# Patient Record
Sex: Female | Born: 1985 | Hispanic: No | Marital: Married | Smoking: Never smoker
Health system: Southern US, Community
[De-identification: ages and names within clinical notes are randomized; demographics above are authoritative.]

## PROBLEM LIST (undated history)

## (undated) ENCOUNTER — Inpatient Hospital Stay (HOSPITAL_COMMUNITY): Payer: Self-pay

## (undated) DIAGNOSIS — G43909 Migraine, unspecified, not intractable, without status migrainosus: Secondary | ICD-10-CM

## (undated) HISTORY — DX: Migraine, unspecified, not intractable, without status migrainosus: G43.909

---

## 2011-05-16 DIAGNOSIS — IMO0002 Reserved for concepts with insufficient information to code with codable children: Secondary | ICD-10-CM

## 2014-04-26 ENCOUNTER — Encounter (HOSPITAL_COMMUNITY): Payer: Self-pay | Admitting: *Deleted

## 2014-04-26 ENCOUNTER — Emergency Department (HOSPITAL_COMMUNITY)
Admission: EM | Admit: 2014-04-26 | Discharge: 2014-04-27 | Disposition: A | Discharge status: Home / Self Care | Payer: Self-pay | Attending: Emergency Medicine | Admitting: Emergency Medicine

## 2014-04-26 DIAGNOSIS — N390 Urinary tract infection, site not specified: Secondary | ICD-10-CM | POA: Insufficient documentation

## 2014-04-26 DIAGNOSIS — R000 Tachycardia, unspecified: Secondary | ICD-10-CM | POA: Insufficient documentation

## 2014-04-26 DIAGNOSIS — R109 Unspecified abdominal pain: Secondary | ICD-10-CM

## 2014-04-26 DIAGNOSIS — Z3201 Encounter for pregnancy test, result positive: Secondary | ICD-10-CM | POA: Insufficient documentation

## 2014-04-26 DIAGNOSIS — E876 Hypokalemia: Secondary | ICD-10-CM | POA: Insufficient documentation

## 2014-04-26 DIAGNOSIS — R112 Nausea with vomiting, unspecified: Secondary | ICD-10-CM | POA: Insufficient documentation

## 2014-04-26 DIAGNOSIS — O26899 Other specified pregnancy related conditions, unspecified trimester: Secondary | ICD-10-CM

## 2014-04-26 DIAGNOSIS — N760 Acute vaginitis: Secondary | ICD-10-CM | POA: Insufficient documentation

## 2014-04-26 DIAGNOSIS — B9689 Other specified bacterial agents as the cause of diseases classified elsewhere: Secondary | ICD-10-CM

## 2014-04-26 LAB — URINALYSIS, ROUTINE W REFLEX MICROSCOPIC
BILIRUBIN URINE: NEGATIVE
Glucose, UA: NEGATIVE mg/dL
Hgb urine dipstick: NEGATIVE
KETONES UR: NEGATIVE mg/dL
NITRITE: NEGATIVE
PROTEIN: NEGATIVE mg/dL
Specific Gravity, Urine: 1.017 (ref 1.005–1.030)
UROBILINOGEN UA: 0.2 mg/dL (ref 0.0–1.0)
pH: 5.5 (ref 5.0–8.0)

## 2014-04-26 LAB — URINE MICROSCOPIC-ADD ON

## 2014-04-26 LAB — PREGNANCY, URINE: Preg Test, Ur: POSITIVE — AB

## 2014-04-26 MED ORDER — ACETAMINOPHEN 325 MG PO TABS
650.0000 mg | ORAL_TABLET | Freq: Once | ORAL | Status: AC
  Administered 2014-04-27: 650 mg via ORAL
  Filled 2014-04-26: qty 2

## 2014-04-26 MED ORDER — SODIUM CHLORIDE 0.9 % IV BOLUS (SEPSIS)
1000.0000 mL | Freq: Once | INTRAVENOUS | Status: AC
  Administered 2014-04-27: 1000 mL via INTRAVENOUS

## 2014-04-26 NOTE — ED Provider Notes (Signed)
CSN: [id number]     Arrival date & time 04/26/14  2242 History   First MD Initiated Contact with Patient 04/26/14 2309     Chief Complaint  Patient presents with  . preg test request      (Consider location/radiation/quality/duration/timing/severity/associated sxs/prior Treatment) HPI Comments: Angela BurkeSehrish Molnar is a 28 y.o. G2P1 healthy female, who presents to the ED accompanied by her husband with complaints of 2 months of lower abdominal cramping and morning nausea/vomiting. History limited due to language barrier, interpreter used. Patient reports that she has had 9/10 crampy lower abdominal pain that radiates to her back is constant and worse in the morning, with no known aggravating or alleviating factors given that she has not tried anything. She reports that she has had intermittent nausea/vomiting especially in the mornings, but is able to tolerate fluid and foods. She states she is here for a pregnancy test. Her last menstrual period was 2 months ago. She additionally states that she has had some dysuria 2 weeks and malodorous urine. Denies fever, chills, chest pain, shortness breath, diarrhea, constipation, hematochezia, melena, hematuria, vaginal bleeding, vaginal discharge, lightheadedness, dizziness, or any other symptoms at this time. Patient reports that she has no medical history and takes no medications. She has not had any prenatal care.  Patient is a 28 y.o. female presenting with abdominal pain. The history is provided by the patient and the spouse. The history is limited by a language barrier. A language interpreter was used.  Abdominal Pain Pain location:  Suprapubic, LLQ and RLQ Pain quality: cramping   Pain radiates to:  Back Pain severity:  Moderate (9/10) Onset quality:  Gradual Duration:  8 weeks Timing:  Constant Progression:  Unchanged Chronicity:  New Context: not recent travel, not sick contacts and not suspicious food intake   Context comment:  Possible  pregnancy Relieved by:  None tried Worsened by:  Nothing tried Ineffective treatments:  None tried Associated symptoms: dysuria (x2 wks), nausea (morning sickness, intermittent) and vomiting   Associated symptoms: no anorexia, no chest pain, no chills, no constipation, no diarrhea, no fatigue, no fever, no flatus, no hematemesis, no hematochezia, no hematuria, no melena, no shortness of breath, no vaginal bleeding and no vaginal discharge   Risk factors: pregnancy     History reviewed. No pertinent past medical history. History reviewed. No pertinent past surgical history. No family history on file. History  Substance Use Topics  . Smoking status: Never Smoker   . Smokeless tobacco: Not on file  . Alcohol Use: No   OB History    No data available     Review of Systems  Constitutional: Negative for fever, chills and fatigue.  Respiratory: Negative for shortness of breath.   Cardiovascular: Negative for chest pain.  Gastrointestinal: Positive for nausea (morning sickness, intermittent), vomiting and abdominal pain. Negative for diarrhea, constipation, blood in stool, melena, hematochezia, anorexia, flatus and hematemesis.  Genitourinary: Positive for dysuria (x2 wks). Negative for urgency, frequency, hematuria, flank pain, decreased urine volume, vaginal bleeding and vaginal discharge.       +malodorous urine  Musculoskeletal: Positive for back pain (radiating from abd). Negative for myalgias and arthralgias.  Skin: Negative for color change.  Neurological: Negative for dizziness, weakness, light-headedness, numbness and headaches.  Hematological: Does not bruise/bleed easily.  Psychiatric/Behavioral: Negative for confusion.      Allergies  Review of patient's allergies indicates no known allergies.  Home Medications   Prior to Admission medications   Not on File  BP 123/79 mmHg  Pulse 110  Temp(Src) 98.1 F (36.7 C) (Oral)  Resp 18  SpO2 98%  LMP  02/24/2014 Physical Exam  Constitutional: She is oriented to person, place, and time. She appears well-developed and well-nourished.  Non-toxic appearance. No distress.  Afebrile, nontoxic, well appearing, with slight tachycardia but otherwise VSS  HENT:  Head: Normocephalic and atraumatic.  Mouth/Throat: Oropharynx is clear and moist and mucous membranes are normal.  Eyes: Conjunctivae and EOM are normal. Right eye exhibits no discharge. Left eye exhibits no discharge.  Neck: Normal range of motion. Neck supple.  Cardiovascular: Regular rhythm, normal heart sounds and intact distal pulses.  Tachycardia present.  Exam reveals no gallop and no friction rub.   No murmur heard. Mildly tachycardic with reg rhythm, nl s1/s2, no m/r/g  Pulmonary/Chest: Effort normal and breath sounds normal. No respiratory distress. She has no decreased breath sounds. She has no wheezes. She has no rhonchi. She has no rales.  Abdominal: Soft. Normal appearance and bowel sounds are normal. She exhibits no distension. There is tenderness in the right lower quadrant, suprapubic area and left lower quadrant. There is no rigidity, no rebound, no guarding, no CVA tenderness, no tenderness at McBurney's point and negative Murphy's sign.    Soft, ND, +BS throughout, with mild lower abd pain diffusely along pelvic brim, no r/g/r, neg murphy's, neg mcburney's, no CVA TTP, no palpable fundus  Genitourinary: Pelvic exam was performed with patient supine. There is no rash, tenderness or lesion on the right labia. There is no rash, tenderness or lesion on the left labia. Uterus is tender. Cervix exhibits no motion tenderness, no discharge and no friability. Right adnexum displays tenderness. Right adnexum displays no mass and no fullness. Left adnexum displays tenderness. Left adnexum displays no mass and no fullness. No erythema, tenderness or bleeding in the vagina. Vaginal discharge (white physiologic) found.  No rashes, lesions,  or tenderness to external genitalia. No erythema, injury, or tenderness to vaginal mucosa. Mild white physiologic vaginal discharge with no bleeding within vaginal vault. No adnexal masses or fullness but with diffuse bilateral TTP. No CMT, cervical friability, or discharge from cervical os. Uterus non-deviated, mobile, with mild TTP diffusely, and without notable enlargement.    Musculoskeletal: Normal range of motion.  MAE x4  Neurological: She is alert and oriented to person, place, and time. She has normal strength. No sensory deficit.  Skin: Skin is warm, dry and intact. No rash noted.  Psychiatric: She has a normal mood and affect.  Nursing note and vitals reviewed.   ED Course  Procedures (including critical care time) Labs Review Labs Reviewed  WET PREP, GENITAL - Abnormal; Notable for the following:    Clue Cells Wet Prep HPF POC MANY (*)    WBC, Wet Prep HPF POC MANY (*)    All other components within normal limits  URINALYSIS, ROUTINE W REFLEX MICROSCOPIC - Abnormal; Notable for the following:    APPearance CLOUDY (*)    Leukocytes, UA MODERATE (*)    All other components within normal limits  PREGNANCY, URINE - Abnormal; Notable for the following:    Preg Test, Ur POSITIVE (*)    All other components within normal limits  URINE MICROSCOPIC-ADD ON - Abnormal; Notable for the following:    Squamous Epithelial / LPF FEW (*)    Bacteria, UA MANY (*)    All other components within normal limits  CBC WITH DIFFERENTIAL - Abnormal; Notable for the following:  Hemoglobin 11.9 (*)    All other components within normal limits  COMPREHENSIVE METABOLIC PANEL - Abnormal; Notable for the following:    Sodium 131 (*)    Potassium 3.3 (*)    Glucose, Bld 120 (*)    Creatinine, Ser 0.40 (*)    Alkaline Phosphatase 29 (*)    All other components within normal limits  HCG, QUANTITATIVE, PREGNANCY - Abnormal; Notable for the following:    hCG, Beta Chain, Quant, S 16109 (*)    All  other components within normal limits  GC/CHLAMYDIA PROBE AMP  URINE CULTURE  LIPASE, BLOOD  RPR  HIV ANTIBODY (ROUTINE TESTING)  ABO/RH    Imaging Review No results found.   EKG Interpretation None      MDM   Final diagnoses:  Abdominal pain in pregnancy  BV (bacterial vaginosis)  UTI (lower urinary tract infection)  Hypokalemia    28 y.o. female here with lower abd cramping x2 months and morning n/v, wanting pregnancy test. LMP 2mos ago. Abd exam with diffuse lower abd tenderness but nonperitoneal. Vaginal exam showing physiologic discharge without bleeding and closed os. Upreg +, will proceed with pregnancy labs and ultrasound to r/o ectopic. Will give fluids since pt is slightly tachycardic. Will give tylenol now for pain. Will reassess shortly.  1:39 AM Wet prep with many clue cells, will treat for BV. CBC w/diff WNL. CMP showing mildly low sodium and potassium, doubt need for repletion. Will give another bolus, first bolus with resolution of tachycardia. Lipase WNL.  Quant HCG [id number]. Blood type O+. U/A with moderate leuks few squamous, 3-6 WBC, and many bacteria. Will send for culture and tx for UTI. U/S still pending. Pt care signed over to Dr. Judd Lien at shift change. Please see his dictation for further documentation of care.  BP 92/56 mmHg  Pulse 84  Temp(Src) 98.1 F (36.7 C) (Oral)  Resp 16  SpO2 100%  LMP 02/24/2014  Meds ordered this encounter  Medications  . acetaminophen (TYLENOL) tablet 650 mg    Sig:   . sodium chloride 0.9 % bolus 1,000 mL    Sig:   . sodium chloride 0.9 % bolus 1,000 mL    Sig:      [place], PA-C 04/27/14 0146  Geoffery Lyons, MD 04/27/14 [phone]

## 2014-04-26 NOTE — ED Notes (Signed)
The pts husband is requesting a preg test.  n v legs hurt.  lmp  2 months.  The female is doing all of the talking his wife is just sitting without saying a word

## 2014-04-27 ENCOUNTER — Emergency Department (HOSPITAL_COMMUNITY): Payer: Self-pay

## 2014-04-27 LAB — COMPREHENSIVE METABOLIC PANEL
ALK PHOS: 29 U/L — AB (ref 39–117)
ALT: 11 U/L (ref 0–35)
ANION GAP: 15 (ref 5–15)
AST: 13 U/L (ref 0–37)
Albumin: 3.5 g/dL (ref 3.5–5.2)
BUN: 8 mg/dL (ref 6–23)
CALCIUM: 9.3 mg/dL (ref 8.4–10.5)
CO2: 19 meq/L (ref 19–32)
Chloride: 97 mEq/L (ref 96–112)
Creatinine, Ser: 0.4 mg/dL — ABNORMAL LOW (ref 0.50–1.10)
GFR calc non Af Amer: >90 mL/min (ref >90)
Glucose, Bld: 120 mg/dL — ABNORMAL HIGH (ref 70–99)
POTASSIUM: 3.3 meq/L — AB (ref 3.7–5.3)
Sodium: 131 mEq/L — ABNORMAL LOW (ref 137–147)
TOTAL PROTEIN: 7.4 g/dL (ref 6.0–8.3)
Total Bilirubin: 0.3 mg/dL (ref 0.3–1.2)

## 2014-04-27 LAB — WET PREP, GENITAL
TRICH WET PREP: NONE SEEN
Yeast Wet Prep HPF POC: NONE SEEN

## 2014-04-27 LAB — CBC WITH DIFFERENTIAL/PLATELET
Basophils Absolute: 0 10*3/uL (ref 0.0–0.1)
Basophils Relative: 0 % (ref 0–1)
EOS ABS: 0 10*3/uL (ref 0.0–0.7)
EOS PCT: 0 % (ref 0–5)
HCT: 36 % (ref 36.0–46.0)
HEMOGLOBIN: 11.9 g/dL — AB (ref 12.0–15.0)
LYMPHS ABS: 1.5 10*3/uL (ref 0.7–4.0)
Lymphocytes Relative: 23 % (ref 12–46)
MCH: 29 pg (ref 26.0–34.0)
MCHC: 33.1 g/dL (ref 30.0–36.0)
MCV: 87.6 fL (ref 78.0–100.0)
MONOS PCT: 8 % (ref 3–12)
Monocytes Absolute: 0.6 10*3/uL (ref 0.1–1.0)
Neutro Abs: 4.7 10*3/uL (ref 1.7–7.7)
Neutrophils Relative %: 69 % (ref 43–77)
Platelets: 184 10*3/uL (ref 150–400)
RBC: 4.11 MIL/uL (ref 3.87–5.11)
RDW: 12.9 % (ref 11.5–15.5)
WBC: 6.8 10*3/uL (ref 4.0–10.5)

## 2014-04-27 LAB — RPR

## 2014-04-27 LAB — HIV ANTIBODY (ROUTINE TESTING W REFLEX): HIV 1&2 Ab, 4th Generation: NONREACTIVE

## 2014-04-27 LAB — HCG, QUANTITATIVE, PREGNANCY: hCG, Beta Chain, Quant, S: 77222 m[IU]/mL — ABNORMAL HIGH (ref <5)

## 2014-04-27 LAB — ABO/RH: ABO/RH(D): O POS

## 2014-04-27 LAB — LIPASE, BLOOD: Lipase: 23 U/L (ref 11–59)

## 2014-04-27 MED ORDER — METRONIDAZOLE 0.75 % VA GEL: 1.0000 | Freq: Two times a day (BID) | VAGINAL | Status: DC

## 2014-04-27 MED ORDER — SODIUM CHLORIDE 0.9 % IV BOLUS (SEPSIS)
1000.0000 mL | Freq: Once | INTRAVENOUS | Status: AC
  Administered 2014-04-27: 1000 mL via INTRAVENOUS

## 2014-04-27 MED ORDER — NITROFURANTOIN MONOHYD MACRO 100 MG PO CAPS: 100.0000 mg | ORAL_CAPSULE | Freq: Two times a day (BID) | ORAL | Status: DC

## 2014-04-27 NOTE — ED Notes (Signed)
Per interpreter: Husband and patient report wanting a pregnancy test. Pt c/o lower, cramping abdominal pain x 2 months. Pain is worse in the morning associated with NV. Pain radiates into lower back. Denies fever, chills, vag bleeding or discharge. Generalized abdominal tenderness on palpation. LMP 2 months ago.

## 2014-04-27 NOTE — Discharge Instructions (Signed)
MetroGel as prescribed.  Macrobid as prescribed for urinary tract infection.  Follow-up with obstetrics. [facility] has been provided on this discharge summary. Call tomorrow to make this appointment.   Bacterial Vaginosis Bacterial vaginosis is a vaginal infection that occurs when the normal balance of bacteria in the vagina is disrupted. It results from an overgrowth of certain bacteria. This is the most common vaginal infection in women of childbearing age. Treatment is important to prevent complications, especially in pregnant women, as it can cause a premature delivery. CAUSES  Bacterial vaginosis is caused by an increase in harmful bacteria that are normally present in smaller amounts in the vagina. Several different kinds of bacteria can cause bacterial vaginosis. However, the reason that the condition develops is not fully understood. RISK FACTORS Certain activities or behaviors can put you at an increased risk of developing bacterial vaginosis, including:  Having a new sex partner or multiple sex partners.  Douching.  Using an intrauterine device (IUD) for contraception. Women do not get bacterial vaginosis from toilet seats, bedding, swimming pools, or contact with objects around them. SIGNS AND SYMPTOMS  Some women with bacterial vaginosis have no signs or symptoms. Common symptoms include:  Grey vaginal discharge.  A fishlike odor with discharge, especially after sexual intercourse.  Itching or burning of the vagina and vulva.  Burning or pain with urination. DIAGNOSIS  Your health care provider will take a medical history and examine the vagina for signs of bacterial vaginosis. A sample of vaginal fluid may be taken. Your health care provider will look at this sample under a microscope to check for bacteria and abnormal cells. A vaginal pH test may also be done.  TREATMENT  Bacterial vaginosis may be treated with antibiotic medicines.  These may be given in the form of a pill or a vaginal cream. A second round of antibiotics may be prescribed if the condition comes back after treatment.  HOME CARE INSTRUCTIONS   Only take over-the-counter or prescription medicines as directed by your health care provider.  If antibiotic medicine was prescribed, take it as directed. Make sure you finish it even if you start to feel better.  Do not have sex until treatment is completed.  Tell all sexual partners that you have a vaginal infection. They should see their health care provider and be treated if they have problems, such as a mild rash or itching.  Practice safe sex by using condoms and only having one sex partner. SEEK MEDICAL CARE IF:   Your symptoms are not improving after 3 days of treatment.  You have increased discharge or pain.  You have a fever. MAKE SURE YOU:   Understand these instructions.  Will watch your condition.  Will get help right away if you are not doing well or get worse. FOR MORE INFORMATION  Centers for Disease Control and Prevention, Division of STD Prevention: SolutionApps.co.zawww.cdc.gov/std American Sexual Health Association (ASHA): www.ashastd.org  Document Released: 05/01/2005 Document Revised: 02/19/2013 Document Reviewed: 12/11/2012 [facility] Patient Information 2015 [place], [place]. This information is not intended to replace advice given to you by your health care provider. Make sure you discuss any questions you have with your health care provider.  Pregnancy and Urinary Tract Infection A urinary tract infection (UTI) is a bacterial infection of the urinary tract. Infection of the urinary tract can include the ureters, kidneys (pyelonephritis), bladder (cystitis), and urethra (urethritis). All pregnant women should be screened for bacteria in the urinary tract. Identifying and  treating a UTI will decrease the risk of preterm labor and developing more serious infections in both the mother and  baby. CAUSES Bacteria germs cause almost all UTIs.  RISK FACTORS Many factors can increase your chances of getting a UTI during pregnancy. These include:  Having a short urethra.  Poor toilet and hygiene habits.  Sexual intercourse.  Blockage of urine along the urinary tract.  Problems with the pelvic muscles or nerves.  Diabetes.  Obesity.  Bladder problems after having several children.  Previous history of UTI. SIGNS AND SYMPTOMS   Pain, burning, or a stinging feeling when urinating.  Suddenly feeling the need to urinate right away (urgency).  Loss of bladder control (urinary incontinence).  Frequent urination, more than is common with pregnancy.  Lower abdominal or back discomfort.  Cloudy urine.  Blood in the urine (hematuria).  Fever. When the kidneys are infected, the symptoms may be:  Back pain.  Flank pain on the right side more so than the left.  Fever.  Chills.  Nausea.  Vomiting. DIAGNOSIS  A urinary tract infection is usually diagnosed through urine tests. Additional tests and procedures are sometimes done. These may include:  Ultrasound exam of the kidneys, ureters, bladder, and urethra.  Looking in the bladder with a lighted tube (cystoscopy). TREATMENT Typically, UTIs can be treated with antibiotic medicines.  HOME CARE INSTRUCTIONS   Only take over-the-counter or prescription medicines as directed by your health care provider. If you were prescribed antibiotics, take them as directed. Finish them even if you start to feel better.  Drink enough fluids to keep your urine clear or pale yellow.  Do not have sexual intercourse until the infection is gone and your health care provider says it is okay.  Make sure you are tested for UTIs throughout your pregnancy. These infections often come back. Preventing a UTI in the Future  Practice good toilet habits. Always wipe from front to back. Use the tissue only once.  Do not hold your  urine. Empty your bladder as soon as possible when the urge comes.  Do not douche or use deodorant sprays.  Wash with soap and warm water around the genital area and the anus.  Empty your bladder before and after sexual intercourse.  Wear underwear with a cotton crotch.  Avoid caffeine and carbonated drinks. They can irritate the bladder.  Drink cranberry juice or take cranberry pills. This may decrease the risk of getting a UTI.  Do not drink alcohol.  Keep all your appointments and tests as scheduled. SEEK MEDICAL CARE IF:   Your symptoms get worse.  You are still having fevers 2 or more days after treatment begins.  You have a rash.  You feel that you are having problems with medicines prescribed.  You have abnormal vaginal discharge. SEEK IMMEDIATE MEDICAL CARE IF:   You have back or flank pain.  You have chills.  You have blood in your urine.  You have nausea and vomiting.  You have contractions of your uterus.  You have a gush of fluid from the vagina. MAKE SURE YOU:  Understand these instructions.   Will watch your condition.   Will get help right away if you are not doing well or get worse.  Document Released: 08/26/2010 Document Revised: 02/19/2013 Document Reviewed: 11/28/2012 [facility] Patient Information 2015 [place], [place]. This information is not intended to replace advice given to you by your health care provider. Make sure you discuss any questions you have with your health  care provider.  

## 2014-04-27 NOTE — ED Notes (Signed)
Dr. Delo at bedside. 

## 2014-04-28 LAB — URINE CULTURE: Colony Count: 4000

## 2014-04-28 LAB — GC/CHLAMYDIA PROBE AMP
CT Probe RNA: NEGATIVE
GC Probe RNA: NEGATIVE

## 2014-05-06 ENCOUNTER — Emergency Department (HOSPITAL_COMMUNITY)
Admission: EM | Admit: 2014-05-06 | Discharge: 2014-05-06 | Disposition: A | Discharge status: Home / Self Care | Payer: Self-pay | Attending: Emergency Medicine | Admitting: Emergency Medicine

## 2014-05-06 ENCOUNTER — Encounter (HOSPITAL_COMMUNITY): Payer: Self-pay | Admitting: *Deleted

## 2014-05-06 DIAGNOSIS — M549 Dorsalgia, unspecified: Secondary | ICD-10-CM | POA: Insufficient documentation

## 2014-05-06 DIAGNOSIS — O210 Mild hyperemesis gravidarum: Secondary | ICD-10-CM | POA: Insufficient documentation

## 2014-05-06 DIAGNOSIS — Z3A08 8 weeks gestation of pregnancy: Secondary | ICD-10-CM | POA: Insufficient documentation

## 2014-05-06 DIAGNOSIS — R52 Pain, unspecified: Secondary | ICD-10-CM

## 2014-05-06 DIAGNOSIS — R109 Unspecified abdominal pain: Secondary | ICD-10-CM | POA: Insufficient documentation

## 2014-05-06 DIAGNOSIS — O9989 Other specified diseases and conditions complicating pregnancy, childbirth and the puerperium: Secondary | ICD-10-CM | POA: Insufficient documentation

## 2014-05-06 DIAGNOSIS — R112 Nausea with vomiting, unspecified: Secondary | ICD-10-CM

## 2014-05-06 LAB — COMPREHENSIVE METABOLIC PANEL
ALK PHOS: 30 U/L — AB (ref 39–117)
ALT: 13 U/L (ref 0–35)
ANION GAP: 7 (ref 5–15)
AST: 14 U/L (ref 0–37)
Albumin: 3.6 g/dL (ref 3.5–5.2)
BILIRUBIN TOTAL: 0.2 mg/dL — AB (ref 0.3–1.2)
BUN: 9 mg/dL (ref 6–23)
CHLORIDE: 106 meq/L (ref 96–112)
CO2: 22 mmol/L (ref 19–32)
Calcium: 8.9 mg/dL (ref 8.4–10.5)
Creatinine, Ser: 0.44 mg/dL — ABNORMAL LOW (ref 0.50–1.10)
GFR calc Af Amer: >90 mL/min (ref >90)
Glucose, Bld: 118 mg/dL — ABNORMAL HIGH (ref 70–99)
POTASSIUM: 3.7 mmol/L (ref 3.5–5.1)
Sodium: 135 mmol/L (ref 135–145)
Total Protein: 6.8 g/dL (ref 6.0–8.3)

## 2014-05-06 LAB — CBC WITH DIFFERENTIAL/PLATELET
Basophils Absolute: 0 10*3/uL (ref 0.0–0.1)
Basophils Relative: 0 % (ref 0–1)
Eosinophils Absolute: 0 10*3/uL (ref 0.0–0.7)
Eosinophils Relative: 0 % (ref 0–5)
HEMATOCRIT: 35.5 % — AB (ref 36.0–46.0)
Hemoglobin: 12 g/dL (ref 12.0–15.0)
LYMPHS ABS: 2 10*3/uL (ref 0.7–4.0)
Lymphocytes Relative: 25 % (ref 12–46)
MCH: 29.5 pg (ref 26.0–34.0)
MCHC: 33.8 g/dL (ref 30.0–36.0)
MCV: 87.2 fL (ref 78.0–100.0)
MONOS PCT: 7 % (ref 3–12)
Monocytes Absolute: 0.5 10*3/uL (ref 0.1–1.0)
NEUTROS ABS: 5.5 10*3/uL (ref 1.7–7.7)
Neutrophils Relative %: 68 % (ref 43–77)
Platelets: 181 10*3/uL (ref 150–400)
RBC: 4.07 MIL/uL (ref 3.87–5.11)
RDW: 12.9 % (ref 11.5–15.5)
WBC: 8 10*3/uL (ref 4.0–10.5)

## 2014-05-06 LAB — PREGNANCY, URINE: Preg Test, Ur: POSITIVE — AB

## 2014-05-06 LAB — URINALYSIS, ROUTINE W REFLEX MICROSCOPIC
Bilirubin Urine: NEGATIVE
Glucose, UA: NEGATIVE mg/dL
HGB URINE DIPSTICK: NEGATIVE
KETONES UR: 15 mg/dL — AB
Leukocytes, UA: NEGATIVE
NITRITE: NEGATIVE
PH: 7 (ref 5.0–8.0)
Protein, ur: NEGATIVE mg/dL
SPECIFIC GRAVITY, URINE: 1.027 (ref 1.005–1.030)
UROBILINOGEN UA: 0.2 mg/dL (ref 0.0–1.0)

## 2014-05-06 LAB — LIPASE, BLOOD: Lipase: 27 U/L (ref 11–59)

## 2014-05-06 MED ORDER — ACETAMINOPHEN 500 MG PO TABS
1000.0000 mg | ORAL_TABLET | Freq: Once | ORAL | Status: AC
  Administered 2014-05-06: 1000 mg via ORAL
  Filled 2014-05-06: qty 2

## 2014-05-06 MED ORDER — ONDANSETRON 4 MG PO TBDP: 4.0000 mg | ORAL_TABLET | Freq: Three times a day (TID) | ORAL | Status: DC | PRN

## 2014-05-06 MED ORDER — ONDANSETRON 4 MG PO TBDP
4.0000 mg | ORAL_TABLET | Freq: Once | ORAL | Status: AC
  Administered 2014-05-06: 4 mg via ORAL
  Filled 2014-05-06: qty 1

## 2014-05-06 MED ORDER — ACETAMINOPHEN 500 MG PO TABS: 1000.0000 mg | ORAL_TABLET | Freq: Three times a day (TID) | ORAL | Status: DC | PRN

## 2014-05-06 NOTE — ED Notes (Signed)
The pt is having abd pain  For 3 weeks with body aches also.  lmp  10 days ago  preg 2-3 months   No vaginal bleeding

## 2014-05-06 NOTE — ED Provider Notes (Signed)
CSN: [id number]     Arrival date & time 05/06/14  2140 History  This chart was scribed for Samantha [name] Samantha Othman, MD by Evon Slackerrance Branch, ED Scribe. This patient was seen in room D34C/D34C and the patient's care was started at 10:56 PM.     Chief Complaint  Patient presents with  . Abdominal Pain    Patient is a 28 y.o. female presenting with abdominal pain. The history is provided by the patient and a relative. No language interpreter was used.  Abdominal Pain Associated symptoms: chills, nausea and vomiting   Associated symptoms: no cough, no diarrhea, no dysuria, no fever and no vaginal bleeding    HPI Comments: Samantha [name] is a 28 y.o. female who presents to the Emergency Department complaining of abdominal pain onset 3 weeks prior. Family states that she has associated generalized myalgias, back pain, chills, nausea  and vomiting (in the morning). Family states that she is 2 months pregnant. Family states that she was recently in the ED for similar symptoms but is unsure of what she was prescribed. Family states she has taken tylenol that has not provided any relief. Pt denies fevers, cough, rhinorrhea, diarrhea, dysuria or vaginal bleeding. Pt states she had similar symptoms during her first pregnancy.  She denies falling or any significant weakness in her bilateral lower extremities.  History reviewed. No pertinent past medical history. History reviewed. No pertinent past surgical history. No family history on file. History  Substance Use Topics  . Smoking status: Never Smoker   . Smokeless tobacco: Not on file  . Alcohol Use: No   OB History    No data available     Review of Systems  Constitutional: Positive for chills. Negative for fever.  HENT: Negative for rhinorrhea.   Respiratory: Negative for cough.   Gastrointestinal: Positive for nausea, vomiting and abdominal pain. Negative for diarrhea.  Genitourinary: Negative for dysuria and vaginal bleeding.  Musculoskeletal: Positive  for back pain.  All other systems reviewed and are negative.   Allergies  Review of patient's allergies indicates no known allergies.  Home Medications   Prior to Admission medications   Medication Sig Start Date End Date Taking? Authorizing Provider  metroNIDAZOLE (METROGEL VAGINAL) 0.75 % vaginal gel Place 1 Applicatorful vaginally 2 (two) times daily. Patient not taking: Reported on 05/06/2014 04/27/14   Geoffery Lyonsouglas Delo, MD  nitrofurantoin, macrocrystal-monohydrate, (MACROBID) 100 MG capsule Take 1 capsule (100 mg total) by mouth 2 (two) times daily. X 7 days Patient not taking: Reported on 05/06/2014 04/27/14   Geoffery Lyonsouglas Delo, MD   Triage Vitals: BP 115/77 mmHg  Pulse 98  Temp(Src) 98 F (36.7 C)  Resp 18  SpO2 98%  LMP 02/24/2014   Physical Exam  Constitutional: She appears well-developed and well-nourished. No distress.  HENT:  Head: Normocephalic and atraumatic.  Eyes: Conjunctivae and EOM are normal. Pupils are equal, round, and reactive to light. No scleral icterus.  Neck: Normal range of motion. Neck supple. No JVD present. No tracheal deviation present. No thyromegaly present.  Cardiovascular: Normal rate, regular rhythm and normal heart sounds.  Exam reveals no gallop and no friction rub.   No murmur heard. Pulmonary/Chest: Effort normal and breath sounds normal. No respiratory distress. She has no wheezes. She exhibits no tenderness.  Abdominal: Soft. Bowel sounds are normal. She exhibits no distension and no mass. There is no tenderness. There is no rebound and no guarding.  Musculoskeletal: Normal range of motion. She exhibits no edema or tenderness.  Lymphadenopathy:  She has no cervical adenopathy.  Neurological: She is alert. No cranial nerve deficit. She exhibits normal muscle tone.  Skin: Skin is warm and dry. No rash noted. No erythema. No pallor.  Nursing note and vitals reviewed.   ED Course  Procedures (including critical care time) DIAGNOSTIC  STUDIES: Oxygen Saturation is 98% on RA, normal by my interpretation.    COORDINATION OF CARE: 11:15 PM-Discussed treatment plan with pt at bedside and pt agreed to plan.     Labs Review Labs Reviewed  CBC WITH DIFFERENTIAL - Abnormal; Notable for the following:    HCT 35.5 (*)    All other components within normal limits  COMPREHENSIVE METABOLIC PANEL - Abnormal; Notable for the following:    Glucose, Bld 118 (*)    Creatinine, Ser 0.44 (*)    Alkaline Phosphatase 30 (*)    Total Bilirubin 0.2 (*)    All other components within normal limits  URINALYSIS, ROUTINE W REFLEX MICROSCOPIC - Abnormal; Notable for the following:    APPearance HAZY (*)    Ketones, ur 15 (*)    All other components within normal limits  PREGNANCY, URINE - Abnormal; Notable for the following:    Preg Test, Ur POSITIVE (*)    All other components within normal limits  LIPASE, BLOOD    Imaging Review No results found.   EKG Interpretation None      MDM   Final diagnoses:  None      Patient since emergency department out of concern for generalized myalgias. States these are worse in the morning and in the late evenings. This occurred with her first pregnancy as well. She was seen 2 weeks ago and had a transvaginal ultrasound which revealed a live IUP, no ectopic pregnancy. At that time she was treated for urinary tract infection. UA today is negative for infection, she has had no vaginal bleeding. Her physical exam is unremarkable to me. Patient was advised to continue taking Tylenol for pain, and was also given Zofran for nausea. OB/GYN follow-up was provided. Her vital signs were within her normal limits and she is safe for discharge.  I personally performed the services described in this documentation, which was scribed in my presence. The recorded information has been reviewed and is accurate.       Samantha [name] [name], MD 05/06/14 [phone]

## 2014-05-06 NOTE — Discharge Instructions (Signed)
Muscle Pain Ms. Samantha [name], you were seen for diffuse body pain. You're laboratory studies and urine were normal. This is likely from your pregnancy. Take Tylenol and Zofran as prescribed for relief, follow-up with an OB/GYN physician as soon as possible for continued management of her pregnancy. If your symptoms worsen come back to emergency department for repeat evaluation. Thank you. Muscle pain (myalgia) may be caused by many things, including:  Overuse or muscle strain, especially if you are not in shape. This is the most common cause of muscle pain.  Injury.  Bruises.  Viruses, such as the flu.  Infectious diseases.  Fibromyalgia, which is a chronic condition that causes muscle tenderness, fatigue, and headache.  Autoimmune diseases, including lupus.  Certain drugs, including ACE inhibitors and statins. Muscle pain may be mild or severe. In most cases, the pain lasts only a short time and goes away without treatment. To diagnose the cause of your muscle pain, your health care provider will take your medical history. This means he or she will ask you when your muscle pain began and what has been happening. If you have not had muscle pain for very long, your health care provider may want to wait before doing much testing. If your muscle pain has lasted a long time, your health care provider may want to run tests right away. If your health care provider thinks your muscle pain may be caused by illness, you may need to have additional tests to rule out certain conditions.  Treatment for muscle pain depends on the cause. Home care is often enough to relieve muscle pain. Your health care provider may also prescribe anti-inflammatory medicine. HOME CARE INSTRUCTIONS Watch your condition for any changes. The following actions may help to lessen any discomfort you are feeling:  Only take over-the-counter or prescription medicines as directed by your health care provider.  Apply ice to the sore  muscle:  Put ice in a plastic bag.  Place a towel between your skin and the bag.  Leave the ice on for 15-20 minutes, 3-4 times a day.  You may alternate applying hot and cold packs to the muscle as directed by your health care provider.  If overuse is causing your muscle pain, slow down your activities until the pain goes away.  Remember that it is normal to feel some muscle pain after starting a workout program. Muscles that have not been used often will be sore at first.  Do regular, gentle exercises if you are not usually active.  Warm up before exercising to lower your risk of muscle pain.  Do not continue working out if the pain is very bad. Bad pain could mean you have injured a muscle. SEEK MEDICAL CARE IF:  Your muscle pain gets worse, and medicines do not help.  You have muscle pain that lasts longer than 3 days.  You have a rash or fever along with muscle pain.  You have muscle pain after a tick bite.  You have muscle pain while working out, even though you are in good physical condition.  You have redness, soreness, or swelling along with muscle pain.  You have muscle pain after starting a new medicine or changing the dose of a medicine. SEEK IMMEDIATE MEDICAL CARE IF:  You have trouble breathing.  You have trouble swallowing.  You have muscle pain along with a stiff neck, fever, and vomiting.  You have severe muscle weakness or cannot move part of your body. MAKE SURE YOU:  Understand these instructions.  Will watch your condition.  Will get help right away if you are not doing well or get worse. Document Released: 03/23/2006 Document Revised: 05/06/2013 Document Reviewed: 02/25/2013 [facility] Patient Information 2015 [place], [place]. This information is not intended to replace advice given to you by your health care provider. Make sure you discuss any questions you have with your health care provider. First Trimester of Pregnancy The first trimester  of pregnancy is from week 1 until the end of week 12 (months 1 through 3). During this time, your baby will begin to develop inside you. At 6-8 weeks, the eyes and face are formed, and the heartbeat can be seen on ultrasound. At the end of 12 weeks, all the baby's organs are formed. Prenatal care is all the medical care you receive before the birth of your baby. Make sure you get good prenatal care and follow all of your doctor's instructions. HOME CARE  Medicines  Take medicine only as told by your doctor. Some medicines are safe and some are not during pregnancy.  Take your prenatal vitamins as told by your doctor.  Take medicine that helps you poop (stool softener) as needed if your doctor says it is okay. Diet  Eat regular, healthy meals.  Your doctor will tell you the amount of weight gain that is right for you.  Avoid raw meat and uncooked cheese.  If you feel sick to your stomach (nauseous) or throw up (vomit):  Eat 4 or 5 small meals a day instead of 3 large meals.  Try eating a few soda crackers.  Drink liquids between meals instead of during meals.  If you have a hard time pooping (constipation):  Eat high-fiber foods like fresh vegetables, fruit, and whole grains.  Drink enough fluids to keep your pee (urine) clear or pale yellow. Activity and Exercise  Exercise only as told by your doctor. Stop exercising if you have cramps or pain in your lower belly (abdomen) or low back.  Try to avoid standing for long periods of time. Move your legs often if you must stand in one place for a long time.  Avoid heavy lifting.  Wear low-heeled shoes. Sit and stand up straight.  You can have sex unless your doctor tells you not to. Relief of Pain or Discomfort  Wear a good support bra if your breasts are sore.  Take warm water baths (sitz baths) to soothe pain or discomfort caused by hemorrhoids. Use hemorrhoid cream if your doctor says it is okay.  Rest with your legs  raised if you have leg cramps or low back pain.  Wear support hose if you have puffy, bulging veins (varicose veins) in your legs. Raise (elevate) your feet for 15 minutes, 3-4 times a day. Limit salt in your diet. Prenatal Care  Schedule your prenatal visits by the twelfth week of pregnancy.  Write down your questions. Take them to your prenatal visits.  Keep all your prenatal visits as told by your doctor. Safety  Wear your seat belt at all times when driving.  Make a list of emergency phone numbers. The list should include numbers for family, friends, the hospital, and police and fire departments. General Tips  Ask your doctor for a referral to a local prenatal class. Begin classes no later than at the start of month 6 of your pregnancy.  Ask for help if you need counseling or help with nutrition. Your doctor can give you advice or tell you where  to go for help.  Do not use hot tubs, steam rooms, or saunas.  Do not douche or use tampons or scented sanitary pads.  Do not cross your legs for long periods of time.  Avoid litter boxes and soil used by cats.  Avoid all smoking, herbs, and alcohol. Avoid drugs not approved by your doctor.  Visit your dentist. At home, brush your teeth with a soft toothbrush. Be gentle when you floss. GET HELP IF:  You are dizzy.  You have mild cramps or pressure in your lower belly.  You have a nagging pain in your belly area.  You continue to feel sick to your stomach, throw up, or have watery poop (diarrhea).  You have a bad smelling fluid coming from your vagina.  You have pain with peeing (urination).  You have increased puffiness (swelling) in your face, hands, legs, or ankles. GET HELP RIGHT AWAY IF:   You have a fever.  You are leaking fluid from your vagina.  You have spotting or bleeding from your vagina.  You have very bad belly cramping or pain.  You gain or lose weight rapidly.  You throw up blood. It may look like  coffee grounds.  You are around people who have [place] measles, fifth disease, or chickenpox.  You have a very bad headache.  You have shortness of breath.  You have any kind of trauma, such as from a fall or a car accident. Document Released: 10/18/2007 Document Revised: 09/15/2013 Document Reviewed: 03/11/2013 [facility] Patient Information 2015 [place], [place]. This information is not intended to replace advice given to you by your health care provider. Make sure you discuss any questions you have with your health care provider. Abdominal Pain During Pregnancy Belly (abdominal) pain is common during pregnancy. Most of the time, it is not a serious problem. Other times, it can be a sign that something is wrong with the pregnancy. Always tell your doctor if you have belly pain. HOME CARE Monitor your belly pain for any changes. The following actions may help you feel better:  Do not have sex (intercourse) or put anything in your vagina until you feel better.  Rest until your pain stops.  Drink clear fluids if you feel sick to your stomach (nauseous). Do not eat solid food until you feel better.  Only take medicine as told by your doctor.  Keep all doctor visits as told. GET HELP RIGHT AWAY IF:   You are bleeding, leaking fluid, or pieces of tissue come out of your vagina.  You have more pain or cramping.  You keep throwing up (vomiting).  You have pain when you pee (urinate) or have blood in your pee.  You have a fever.  You do not feel your baby moving as much.  You feel very weak or feel like passing out.  You have trouble breathing, with or without belly pain.  You have a very bad headache and belly pain.  You have fluid leaking from your vagina and belly pain.  You keep having watery poop (diarrhea).  Your belly pain does not go away after resting, or the pain gets worse. MAKE SURE YOU:   Understand these instructions.  Will watch your condition.  Will get  help right away if you are not doing well or get worse. Document Released: 04/19/2009 Document Revised: 01/01/2013 Document Reviewed: 11/28/2012 [facility] Patient Information 2015 [place], [place]. This information is not intended to replace advice given to you by your health care provider.  Make sure you discuss any questions you have with your health care provider. ° °

## 2014-05-15 NOTE — L&D Delivery Note (Signed)
  Delivery Note At 6:28 PM a viable female was delivered via VBAC, Spontaneous (Presentation: ; Occiput Anterior).  APGAR: 8, 9; weight pending .   Placenta status: Intact, Spontaneous.  Cord: 3 vessels  with the following complications: delivered through nuchal cord x1 via somersault maneuver.  Cord pH: N/A  Mother pushed for 2.5 hours with poor maternal effort.   Anesthesia: Epidural  Episiotomy: None Lacerations:  2nd degree  Suture Repair: vicryl rapide Est. Blood Loss (mL):  50  Mom to postpartum.  Baby to Couplet care / Skin to Skin.  De Hollingsheadatherine L Wallace, D.O. PGY 1 Resident 11/19/2014, 7:24 PM  I was present for the entire delivery of baby and placenta and repair and agree with above.  Gloria Gl[place] Park[place]ia Braelee Herrle, [place] 11/19/2014 9:11 PM

## 2014-06-23 ENCOUNTER — Emergency Department (HOSPITAL_COMMUNITY)
Admission: EM | Admit: 2014-06-23 | Discharge: 2014-06-23 | Disposition: A | Discharge status: Home / Self Care | Payer: Medicaid Other | Attending: Emergency Medicine | Admitting: Emergency Medicine

## 2014-06-23 ENCOUNTER — Encounter (HOSPITAL_COMMUNITY): Payer: Self-pay | Admitting: Emergency Medicine

## 2014-06-23 DIAGNOSIS — O2342 Unspecified infection of urinary tract in pregnancy, second trimester: Secondary | ICD-10-CM | POA: Insufficient documentation

## 2014-06-23 DIAGNOSIS — O210 Mild hyperemesis gravidarum: Secondary | ICD-10-CM | POA: Insufficient documentation

## 2014-06-23 DIAGNOSIS — Z3A17 17 weeks gestation of pregnancy: Secondary | ICD-10-CM | POA: Diagnosis not present

## 2014-06-23 DIAGNOSIS — O9989 Other specified diseases and conditions complicating pregnancy, childbirth and the puerperium: Secondary | ICD-10-CM | POA: Diagnosis present

## 2014-06-23 DIAGNOSIS — N390 Urinary tract infection, site not specified: Secondary | ICD-10-CM

## 2014-06-23 DIAGNOSIS — Z3492 Encounter for supervision of normal pregnancy, unspecified, second trimester: Secondary | ICD-10-CM

## 2014-06-23 LAB — URINALYSIS, ROUTINE W REFLEX MICROSCOPIC
Bilirubin Urine: NEGATIVE
GLUCOSE, UA: NEGATIVE mg/dL
Hgb urine dipstick: NEGATIVE
KETONES UR: NEGATIVE mg/dL
NITRITE: NEGATIVE
PH: 7.5 (ref 5.0–8.0)
Protein, ur: NEGATIVE mg/dL
SPECIFIC GRAVITY, URINE: 1.005 (ref 1.005–1.030)
Urobilinogen, UA: 0.2 mg/dL (ref 0.0–1.0)

## 2014-06-23 LAB — URINE MICROSCOPIC-ADD ON

## 2014-06-23 MED ORDER — CEPHALEXIN 500 MG PO CAPS: 500.0000 mg | ORAL_CAPSULE | Freq: Four times a day (QID) | ORAL | Status: DC

## 2014-06-23 NOTE — ED Provider Notes (Addendum)
CSN: [id number]     Arrival date & time 06/23/14  0116 History  This chart was scribed for Samantha SproutWhitney Sawyer Kahan, MD by Annye AsaAnna Dorsett, ED Scribe. This patient was seen in room A13C/A13C and the patient's care was started at 2:22 AM.    Chief Complaint  Patient presents with  . Generalized Body Aches   The history is provided by the patient and the spouse. No language interpreter was used (Spouse translated).     HPI Comments: Samantha [name] is an otherwise healthy, [redacted] weeks pregnant 29 y.o. female who presents to the Emergency Department complaining of 1 week of constant lower back pain and abdominal pressure. She notes one episode of vomiting. Her current symptoms do not remind her of the morning sickness she experienced earlier in the pregnancy. She is eating and drinking without issue. She denies urinary symptoms. She denies cough, SOB.   One other child born via caesarian; no complications, no abnormal bleeding, no pregnancy associated HTN. She has not yet seen an OB for this pregnancy.   History reviewed. No pertinent past medical history. History reviewed. No pertinent past surgical history. No family history on file. History  Substance Use Topics  . Smoking status: Never Smoker   . Smokeless tobacco: Not on file  . Alcohol Use: No   OB History    Gravida Para Term Preterm AB TAB SAB Ectopic Multiple Living   1              Review of Systems  A complete 10 system review of systems was obtained and all systems are negative except as noted in the HPI and PMH.   Allergies  Review of patient's allergies indicates no known allergies.  Home Medications   Prior to Admission medications   Medication Sig Start Date End Date Taking? Authorizing Provider  acetaminophen (TYLENOL) 500 MG tablet Take 2 tablets (1,000 mg total) by mouth every 8 (eight) hours as needed for moderate pain. 05/06/14   Tomasita CrumbleAdeleke Oni, MD  ondansetron (ZOFRAN-ODT) 4 MG disintegrating tablet Take 1 tablet (4 mg total) by  mouth every 8 (eight) hours as needed for nausea or vomiting. 05/06/14   Tomasita CrumbleAdeleke Oni, MD   BP 106/65 mmHg  Pulse 90  Temp(Src) 99.1 F (37.3 C) (Oral)  Resp 20  Ht 5' (1.524 m)  SpO2 97%  LMP 02/24/2014 Physical Exam  Constitutional: She is oriented to person, place, and time. She appears well-developed and well-nourished. No distress.  HENT:  Head: Normocephalic and atraumatic.  Mouth/Throat: Oropharynx is clear and moist. No oropharyngeal exudate.  Eyes: EOM are normal. Pupils are equal, round, and reactive to light.  Neck: Normal range of motion. Neck supple.  Cardiovascular: Normal rate, regular rhythm and normal heart sounds.  Exam reveals no gallop and no friction rub.   No murmur heard. Pulmonary/Chest: Effort normal. No respiratory distress. She has no wheezes. She has no rales.  Abdominal: Soft. There is no tenderness. There is no rebound and no guarding.  No abdominal pain  Genitourinary:  No CVA tenderness  Musculoskeletal: Normal range of motion. She exhibits tenderness. She exhibits no edema.  Mild midline L-spine tenderness  Neurological: She is alert and oriented to person, place, and time.  Skin: Skin is warm and dry. No rash noted.  Psychiatric: She has a normal mood and affect. Her behavior is normal.  Nursing note and vitals reviewed.   ED Course  Procedures   DIAGNOSTIC STUDIES: Oxygen Saturation is 97% on RA, adequate by  my interpretation.    COORDINATION OF CARE: 2:35 AM Discussed treatment plan with pt at bedside and pt agreed to plan.   Labs Review Labs Reviewed  URINALYSIS, ROUTINE W REFLEX MICROSCOPIC - Abnormal; Notable for the following:    Color, Urine STRAW (*)    Leukocytes, UA MODERATE (*)    All other components within normal limits  URINE MICROSCOPIC-ADD ON - Abnormal; Notable for the following:    Squamous Epithelial / LPF FEW (*)    Bacteria, UA FEW (*)    All other components within normal limits    Imaging Review No results  found.  EMERGENCY DEPARTMENT [place] PREGNANCY "Study: Limited Ultrasound of the Pelvis"  INDICATIONS:Pelvic pain Multiple views of the uterus and pelvic cavity are obtained with a multi-frequency probe.  APPROACH:Transabdominal   PERFORMED BY: Myself  IMAGES ARCHIVED?: No  LIMITATIONS: none  PREGNANCY FREE FLUID: None  PREGNANCY UTERUS FINDINGS:Uterus enlarged ADNEXAL FINDINGS:Left ovary not seen and Right ovary not seen  PREGNANCY FINDINGS: Fetal heart activity seen  INTERPRETATION: Viable intrauterine pregnancy  GESTATIONAL AGE, ESTIMATE: 17w  FETAL HEART RATE: 176  COMMENT(Estimate of Gestational Age):  Normal IUP     MDM   Final diagnoses:  Second trimester pregnancy  UTI (lower urinary tract infection)   Patient has approximately [redacted] weeks pregnant who is not received any prenatal care because she is waiting for Medicaid presents today with one week of lower back pain and intermittent generalized lower abdominal pressure. She denies any vaginal bleeding, urinary complaints, fever or chills. She is a G2 P1 without prior complications in her previous pregnancy except when she went into labor she required C-section. Child was born in [place].  Patient is well-appearing with normal vital signs. Bedside ultrasound shows a 17 week fetus with good fetal movement and heart rate in the 170s.  Will check UA for further evaluation to ensure patient does not have a urinary tract infection causing her symptoms.  4:20 AM Patient's urine consistent with UTI. Will treat with Keflex at this time as patient does not have insurance and given her women's clinic number  I personally performed the services described in this documentation, which was scribed in my presence.  The recorded information has been reviewed and considered.      Samantha Sprout, MD 06/23/14 1610  Samantha Sprout, MD 06/23/14 [id number]

## 2014-06-23 NOTE — ED Notes (Signed)
Pt a/o x 4 on d/c with family. Pt refused wheelchair. 

## 2014-06-23 NOTE — ED Notes (Signed)
Asked patient to change into gown.  

## 2014-06-23 NOTE — ED Notes (Signed)
Pt. Reports low back pain and generalized body aches onset this week , denies injury or fall , no dysuria , spouse stated that she is 4 months pregnant.

## 2014-07-29 ENCOUNTER — Encounter: Payer: Self-pay | Admitting: Family

## 2014-07-30 ENCOUNTER — Encounter: Payer: Self-pay | Admitting: Family

## 2014-07-30 ENCOUNTER — Other Ambulatory Visit (HOSPITAL_COMMUNITY)
Admission: RE | Admit: 2014-07-30 | Discharge: 2014-07-30 | Disposition: A | Discharge status: Home / Self Care | Payer: Medicaid Other | Source: Ambulatory Visit | Attending: Obstetrics and Gynecology | Admitting: Obstetrics and Gynecology

## 2014-07-30 ENCOUNTER — Ambulatory Visit (INDEPENDENT_AMBULATORY_CARE_PROVIDER_SITE_OTHER): Payer: Medicaid Other | Admitting: Family

## 2014-07-30 VITALS — BP 118/81 | HR 106 | Wt 138.3 lb

## 2014-07-30 DIAGNOSIS — Z3490 Encounter for supervision of normal pregnancy, unspecified, unspecified trimester: Secondary | ICD-10-CM | POA: Insufficient documentation

## 2014-07-30 DIAGNOSIS — Z3492 Encounter for supervision of normal pregnancy, unspecified, second trimester: Secondary | ICD-10-CM

## 2014-07-30 DIAGNOSIS — Z113 Encounter for screening for infections with a predominantly sexual mode of transmission: Secondary | ICD-10-CM | POA: Insufficient documentation

## 2014-07-30 DIAGNOSIS — O3421 Maternal care for scar from previous cesarean delivery: Secondary | ICD-10-CM

## 2014-07-30 DIAGNOSIS — Z01419 Encounter for gynecological examination (general) (routine) without abnormal findings: Secondary | ICD-10-CM | POA: Diagnosis present

## 2014-07-30 DIAGNOSIS — O0932 Supervision of pregnancy with insufficient antenatal care, second trimester: Secondary | ICD-10-CM

## 2014-07-30 DIAGNOSIS — O34219 Maternal care for unspecified type scar from previous cesarean delivery: Secondary | ICD-10-CM

## 2014-07-30 DIAGNOSIS — K219 Gastro-esophageal reflux disease without esophagitis: Secondary | ICD-10-CM

## 2014-07-30 DIAGNOSIS — Z98891 History of uterine scar from previous surgery: Secondary | ICD-10-CM | POA: Insufficient documentation

## 2014-07-30 LAB — POCT URINALYSIS DIP (DEVICE)
BILIRUBIN URINE: NEGATIVE
Glucose, UA: NEGATIVE mg/dL
Hgb urine dipstick: NEGATIVE
KETONES UR: NEGATIVE mg/dL
NITRITE: NEGATIVE
PROTEIN: NEGATIVE mg/dL
SPECIFIC GRAVITY, URINE: 1.015 (ref 1.005–1.030)
Urobilinogen, UA: 0.2 mg/dL (ref 0.0–1.0)
pH: 6 (ref 5.0–8.0)

## 2014-07-30 LAB — WET PREP, GENITAL
Clue Cells Wet Prep HPF POC: NONE SEEN
TRICH WET PREP: NONE SEEN

## 2014-07-30 MED ORDER — FAMOTIDINE 40 MG PO TABS: 40.0000 mg | ORAL_TABLET | Freq: Every day | ORAL | Status: DC

## 2014-07-30 NOTE — Progress Notes (Signed)
Anatomy 08/06/14 @ 930a.  Interpreter Riffat Hussain present.

## 2014-07-30 NOTE — Progress Notes (Signed)
   Subjective:    Samantha BurkeSehrish [name] is a G2P1001 9859w5d being seen today for her first obstetrical visit.  Her obstetrical history is significant for previoius csection. [place].  Denies any problems or concerns with this pregnancy.  Patient does intend to breast feed. Pregnancy history fully reviewed.  Patient reports heartburn.  Reports reflux x 2 months.    Filed Vitals:   07/30/14 0935  BP: 118/81  Pulse: 106  Weight: 138 lb 4.8 oz (62.732 kg)    HISTORY: OB History  Gravida Para Term Preterm AB SAB TAB Ectopic Multiple Living  2 1 1       1     # Outcome Date GA Lbr Len/2nd Weight Sex Delivery Anes PTL Lv  2 Current           1 Term 05/16/11 5443w0d   F CS-Unspec  N      Complications: Fetal Intolerance,Nuchal cord     Past Medical History  Diagnosis Date  . Medical history non-contributory    Past Surgical History  Procedure Laterality Date  . Cesarean section     Family History  Problem Relation Age of Onset  . Asthma Father    Exam   BP 118/81 mmHg  Pulse 106  Wt 138 lb 4.8 oz (62.732 kg)  LMP 02/24/2014 Uterine Size: size equals dates  Pelvic Exam:    Perineum: No Hemorrhoids, Normal Perineum   Vulva: normal   Vagina:  normal mucosa, white thick discharge, no palpable nodules   pH: Not done   Cervix: no bleeding following Pap, no cervical motion tenderness and no lesions   Adnexa: normal adnexa and no mass, fullness, tenderness   Bony Pelvis: Adequate  System: Breast:  No nipple retraction or dimpling, No nipple discharge or bleeding, No axillary or supraclavicular adenopathy, Normal to palpation without dominant masses   Skin: normal coloration and turgor, no rashes    Neurologic: negative   Extremities: normal strength, tone, and muscle mass   HEENT neck supple with midline trachea and thyroid without masses   Mouth/Teeth mucous membranes moist, pharynx normal without lesions   Neck supple and no masses   Cardiovascular: regular rate  and rhythm, no murmurs or gallops   Respiratory:  appears well, vitals normal, no respiratory distress, acyanotic, normal RR, neck free of mass or lymphadenopathy, chest clear, no wheezing, crepitations, rhonchi, normal symmetric air entry   Abdomen: soft, non-tender; bowel sounds normal; no masses,  no organomegaly   Urinary: urethral meatus normal     Assessment:    Pregnancy:  29 y.o. G2P1001 at 7659w5d wks IUP by 10 wk Ultrasound GERD   Vaginal Discharge Patient Active Problem List   Diagnosis Date Noted  . Supervision of normal pregnancy 07/30/2014  . Previous cesarean delivery, antepartum 07/30/2014        Plan:     Initial labs drawn.  Pap smear collected. Wet Prep collected - call interpreter Samantha [name] to give results [phone]) Prenatal vitamins Reviewed at length via interpreter risks and benefits of TOLAC vs repeat csection.  Pt desires to take consent home and review with husband.   RX Pepcid  Problem list reviewed and updated. Genetic Screening discussed:  Too late  Ultrasound discussed; fetal survey: ordered.  Follow up in 4 weeks.  Marlis EdelsonKARIM, WALIDAH N 07/30/2014

## 2014-07-30 NOTE — Progress Notes (Signed)
Pt reports heartburn that prevents her from eating.

## 2014-07-31 LAB — PRENATAL PROFILE (SOLSTAS)
Antibody Screen: NEGATIVE
BASOS ABS: 0 10*3/uL (ref 0.0–0.1)
Basophils Relative: 0 % (ref 0–1)
EOS PCT: 0 % (ref 0–5)
Eosinophils Absolute: 0 10*3/uL (ref 0.0–0.7)
HEMATOCRIT: 34.1 % — AB (ref 36.0–46.0)
HEMOGLOBIN: 11.3 g/dL — AB (ref 12.0–15.0)
HIV 1&2 Ab, 4th Generation: NONREACTIVE
Hepatitis B Surface Ag: NEGATIVE
LYMPHS ABS: 1.3 10*3/uL (ref 0.7–4.0)
LYMPHS PCT: 19 % (ref 12–46)
MCH: 30.7 pg (ref 26.0–34.0)
MCHC: 33.1 g/dL (ref 30.0–36.0)
MCV: 92.7 fL (ref 78.0–100.0)
MPV: 11.1 fL (ref 8.6–12.4)
Monocytes Absolute: 0.5 10*3/uL (ref 0.1–1.0)
Monocytes Relative: 7 % (ref 3–12)
NEUTROS PCT: 74 % (ref 43–77)
Neutro Abs: 5.1 10*3/uL (ref 1.7–7.7)
Platelets: 200 10*3/uL (ref 150–400)
RBC: 3.68 MIL/uL — ABNORMAL LOW (ref 3.87–5.11)
RDW: 13.7 % (ref 11.5–15.5)
RH TYPE: POSITIVE
Rubella: 4.31 Index — ABNORMAL HIGH (ref <0.90)
WBC: 6.9 10*3/uL (ref 4.0–10.5)

## 2014-07-31 LAB — CULTURE, OB URINE: Colony Count: 30000

## 2014-08-01 LAB — PRESCRIPTION MONITORING PROFILE (19 PANEL)
Amphetamine/Meth: NEGATIVE ng/mL
BUPRENORPHINE, URINE: NEGATIVE ng/mL
Barbiturate Screen, Urine: NEGATIVE ng/mL
Benzodiazepine Screen, Urine: NEGATIVE ng/mL
COCAINE METABOLITES: NEGATIVE ng/mL
Cannabinoid Scrn, Ur: NEGATIVE ng/mL
Carisoprodol, Urine: NEGATIVE ng/mL
Creatinine, Urine: 70.85 mg/dL (ref >20.0)
Fentanyl, Ur: NEGATIVE ng/mL
MDMA URINE: NEGATIVE ng/mL
MEPERIDINE UR: NEGATIVE ng/mL
Methadone Screen, Urine: NEGATIVE ng/mL
Methaqualone: NEGATIVE ng/mL
NITRITES URINE, INITIAL: NEGATIVE ug/mL
Opiate Screen, Urine: NEGATIVE ng/mL
Oxycodone Screen, Ur: NEGATIVE ng/mL
PH URINE, INITIAL: 6.9 pH (ref 4.5–8.9)
Phencyclidine, Ur: NEGATIVE ng/mL
Propoxyphene: NEGATIVE ng/mL
TAPENTADOLUR: NEGATIVE ng/mL
Tramadol Scrn, Ur: NEGATIVE ng/mL
Zolpidem, Urine: NEGATIVE ng/mL

## 2014-08-04 LAB — CYTOLOGY - PAP

## 2014-08-06 ENCOUNTER — Ambulatory Visit (HOSPITAL_COMMUNITY)
Admission: RE | Admit: 2014-08-06 | Discharge: 2014-08-06 | Disposition: A | Discharge status: Home / Self Care | Payer: Medicaid Other | Source: Ambulatory Visit | Attending: Family | Admitting: Family

## 2014-08-06 DIAGNOSIS — O34219 Maternal care for unspecified type scar from previous cesarean delivery: Secondary | ICD-10-CM | POA: Insufficient documentation

## 2014-08-06 DIAGNOSIS — Z3492 Encounter for supervision of normal pregnancy, unspecified, second trimester: Secondary | ICD-10-CM

## 2014-08-06 DIAGNOSIS — Z3A24 24 weeks gestation of pregnancy: Secondary | ICD-10-CM | POA: Insufficient documentation

## 2014-08-06 DIAGNOSIS — Z3689 Encounter for other specified antenatal screening: Secondary | ICD-10-CM | POA: Insufficient documentation

## 2014-08-14 ENCOUNTER — Encounter: Payer: Self-pay | Admitting: Family

## 2014-08-14 ENCOUNTER — Other Ambulatory Visit: Payer: Self-pay | Admitting: Family

## 2014-08-14 DIAGNOSIS — B373 Candidiasis of vulva and vagina: Secondary | ICD-10-CM | POA: Insufficient documentation

## 2014-08-14 DIAGNOSIS — B3731 Acute candidiasis of vulva and vagina: Secondary | ICD-10-CM | POA: Insufficient documentation

## 2014-08-14 MED ORDER — FLUCONAZOLE 150 MG PO TABS: 150.0000 mg | ORAL_TABLET | Freq: Once | ORAL | Status: DC

## 2014-08-14 NOTE — Progress Notes (Signed)
Unable to reach pt by phone to notify regarding +yeast infection.  Please inform at next office visit.

## 2014-08-24 ENCOUNTER — Encounter (HOSPITAL_COMMUNITY): Payer: Self-pay | Admitting: Emergency Medicine

## 2014-08-24 ENCOUNTER — Emergency Department (HOSPITAL_COMMUNITY)
Admission: EM | Admit: 2014-08-24 | Discharge: 2014-08-24 | Disposition: A | Discharge status: Home / Self Care | Payer: Medicaid Other | Attending: Emergency Medicine | Admitting: Emergency Medicine

## 2014-08-24 DIAGNOSIS — M791 Myalgia: Secondary | ICD-10-CM | POA: Insufficient documentation

## 2014-08-24 DIAGNOSIS — J029 Acute pharyngitis, unspecified: Secondary | ICD-10-CM | POA: Diagnosis present

## 2014-08-24 DIAGNOSIS — R21 Rash and other nonspecific skin eruption: Secondary | ICD-10-CM | POA: Diagnosis not present

## 2014-08-24 DIAGNOSIS — R52 Pain, unspecified: Secondary | ICD-10-CM

## 2014-08-24 DIAGNOSIS — Z7952 Long term (current) use of systemic steroids: Secondary | ICD-10-CM | POA: Diagnosis not present

## 2014-08-24 DIAGNOSIS — Z79899 Other long term (current) drug therapy: Secondary | ICD-10-CM | POA: Insufficient documentation

## 2014-08-24 LAB — RAPID STREP SCREEN (MED CTR MEBANE ONLY): Streptococcus, Group A Screen (Direct): NEGATIVE

## 2014-08-24 MED ORDER — HYDROCORTISONE 0.5 % EX CREA: 1.0000 "application " | TOPICAL_CREAM | Freq: Two times a day (BID) | CUTANEOUS | Status: DC

## 2014-08-24 NOTE — ED Notes (Signed)
The patient said she has been having sore throat, chills, body aches and cough for four days.  The patient is six month pregnant and says there are no problems with her pregnancy.  The patient denies any fever or any other symptoms.

## 2014-08-24 NOTE — ED Provider Notes (Signed)
CSN: [id number]     Arrival date & time 08/24/14  1903 History  This chart was scribed for Samantha Peliffany Jacobs Golab, PA-C, working with Blake DivineJohn Wofford, MD by Elon SpannerGarrett Cook, ED Scribe. This patient was seen in room TR08C/TR08C and the patient's care was started at 8:42 PM.   Chief Complaint  Patient presents with  . Sore Throat    The patient said she has been having sore throat, chills, body aches and cough for four days.  The patient is six month pregnant and says there are no problems with her pregnancy.   HPI HPI Comments:  Patient is from [place] and does not speak any english, the interview is done with an interpretor.  Samantha [name] is a 29 y.o. female who presents to the Emergency Department complaining of generalized body aches with associated minor cough and chills onset 4 days ago.  She also notes some minor ear pain, sore throat, body aches, chills..  Patient has taken Tylenol with minor relief.  Patient is 6 months pregnant without complicatoins., denies having abdominal pains, vaginal bleeding, discharge, feeling fatigued.  She is followed by a physician at [facility] and is taking prenatal vitamins. She also reports a dry, itchy rash to the posterior of her right lower knee that has been there for "a long time".  Patient denies fever, vomiting, dysuria, abdominal pain, diarrhea, confusion, insect bites.  NKA.   Past Medical History  Diagnosis Date  . Medical history non-contributory    Past Surgical History  Procedure Laterality Date  . Cesarean section     Family History  Problem Relation Age of Onset  . Asthma Father    History  Substance Use Topics  . Smoking status: Never Smoker   . Smokeless tobacco: Not on file  . Alcohol Use: No   OB History    Gravida Para Term Preterm AB TAB SAB Ectopic Multiple Living   2 1 1       1      Review of Systems  Constitutional: Negative for fever.  HENT: Positive for sore throat.   Gastrointestinal: Negative for nausea, vomiting,  abdominal pain, diarrhea and constipation.  Genitourinary: Negative for dysuria.  Musculoskeletal: Positive for myalgias.  Skin: Positive for rash. Negative for pallor.      Allergies  Review of patient's allergies indicates no known allergies.  Home Medications   Prior to Admission medications   Medication Sig Start Date End Date Taking? Authorizing Provider  famotidine (PEPCID) 40 MG tablet Take 1 tablet (40 mg total) by mouth daily. 07/30/14   Marlis EdelsonWalidah N Karim, CNM  fluconazole (DIFLUCAN) 150 MG tablet Take 1 tablet (150 mg total) by mouth once. 08/14/14   Marlis EdelsonWalidah N Karim, CNM  hydrocortisone cream 0.5 % Apply 1 application topically 2 (two) times daily. Apply only to rash. Do not put in your mouth. 08/24/14   Samantha Peliffany Suman Trivedi, PA-C  Prenatal Vit-Fe Fumarate-FA (PRENATAL VITAMINS PLUS) 27-1 MG TABS Take 1 tablet by mouth.    Historical Provider, MD   BP 100/66 mmHg  Pulse 83  Temp(Src) 98.6 F (37 C) (Oral)  Resp 22  Wt 140 lb (63.504 kg)  SpO2 99%  LMP 02/24/2014 Physical Exam  Constitutional: She appears well-developed and well-nourished. No distress.  HENT:  Head: Normocephalic and atraumatic.  Eyes: Pupils are equal, round, and reactive to light.  Neck: Normal range of motion. Neck supple.  Cardiovascular: Normal rate and regular rhythm.   Pulmonary/Chest: Effort normal. No respiratory distress. She has no  wheezes.  Abdominal: Soft. She exhibits no distension. There is no tenderness.  Musculoskeletal: Normal range of motion.  Neurological: She is alert.  Skin: Skin is warm and dry. Rash noted. No purpura noted. Rash is not vesicular.     Nursing note and vitals reviewed.   ED Course  Procedures (including critical care time)  DIAGNOSTIC STUDIES: Oxygen Saturation is 97% on RA, normal by my interpretation.    COORDINATION OF CARE:    Labs Review Labs Reviewed  RAPID STREP SCREEN  CULTURE, GROUP A STREP    Imaging Review No results found.   EKG  Interpretation None      MDM   Final diagnoses:  Body aches    Patients symptoms are consistent with viral illness. She has had cough but due to pregnancy, normal lung sounds and vital signs a chest xray would be more risk than benefit. She has had a normal strep screen as well.  It is recommended that she get plenty of sleep, rest and drink a lot of fluids. Topical hydrocortisone cream prescribed for eczema. I recommend she f/u with [facility] for a follow-up visit within the next few days.. 28 y.o.Samantha [name]'s evaluation in the Emergency Department is complete. It has been determined that no acute conditions requiring further emergency intervention are present at this time. The patient/guardian have been advised of the diagnosis and plan. We have discussed signs and symptoms that warrant return to the ED, such as changes or worsening in symptoms.  Vital signs are stable at discharge. Filed Vitals:   08/24/14 2052  BP: 100/66  Pulse: 83  Temp: 98.6 F (37 C)  Resp: 22    Patient/guardian has voiced understanding and agreed to follow-up with the PCP or specialist.  I personally performed the services described in this documentation, which was scribed in my presence. The recorded information has been reviewed and is accurate.     Samantha Pel, PA-C 08/25/14 2044  Blake Divine, MD 08/28/14 1001

## 2014-08-24 NOTE — Discharge Instructions (Signed)

## 2014-08-27 ENCOUNTER — Ambulatory Visit (INDEPENDENT_AMBULATORY_CARE_PROVIDER_SITE_OTHER): Payer: Medicaid Other | Admitting: Family

## 2014-08-27 VITALS — BP 113/72 | HR 102 | Temp 98.0°F | Wt 142.3 lb

## 2014-08-27 DIAGNOSIS — Z3492 Encounter for supervision of normal pregnancy, unspecified, second trimester: Secondary | ICD-10-CM

## 2014-08-27 DIAGNOSIS — Z23 Encounter for immunization: Secondary | ICD-10-CM

## 2014-08-27 DIAGNOSIS — Z3483 Encounter for supervision of other normal pregnancy, third trimester: Secondary | ICD-10-CM

## 2014-08-27 LAB — POCT URINALYSIS DIP (DEVICE)
BILIRUBIN URINE: NEGATIVE
Glucose, UA: NEGATIVE mg/dL
KETONES UR: NEGATIVE mg/dL
Nitrite: NEGATIVE
Protein, ur: NEGATIVE mg/dL
Urobilinogen, UA: 0.2 mg/dL (ref 0.0–1.0)
pH: 6.5 (ref 5.0–8.0)

## 2014-08-27 LAB — CULTURE, GROUP A STREP: Strep A Culture: NEGATIVE

## 2014-08-27 MED ORDER — FLUCONAZOLE 150 MG PO TABS: 150.0000 mg | ORAL_TABLET | Freq: Once | ORAL | Status: DC

## 2014-08-27 MED ORDER — TETANUS-DIPHTH-ACELL PERTUSSIS 5-2.5-18.5 LF-MCG/0.5 IM SUSP
0.5000 mL | Freq: Once | INTRAMUSCULAR | Status: AC
  Administered 2014-08-27: 0.5 mL via INTRAMUSCULAR

## 2014-08-27 NOTE — Progress Notes (Signed)
C/o lower back pain. Tdap and Flu.  Still c/o of a lot of vaginal discharge-- treated with Diflucan 3/17. \ Requests dental letter. Gave information for [facility].  C/o sore throat for 7 days-- approved OTC medication list given.

## 2014-08-27 NOTE — Progress Notes (Signed)
UA resulted small leukocytes, small blood

## 2014-08-27 NOTE — Progress Notes (Signed)
Reports lower back pain with walking.  Denies vaginal bleeding or contractions.  Informed about yeast infection, unable to contact patient due to language barrier on phone.  Pt given paper RX for diflucan.  Third trimester labs obtained today (1 hr, RPR, CBC, HIV).  Tdap and flu vaccine today.  Pt given dental letter for teeth cleaning.

## 2014-08-28 LAB — CBC
HEMATOCRIT: 33.6 % — AB (ref 36.0–46.0)
Hemoglobin: 11.3 g/dL — ABNORMAL LOW (ref 12.0–15.0)
MCH: 31 pg (ref 26.0–34.0)
MCHC: 33.6 g/dL (ref 30.0–36.0)
MCV: 92.1 fL (ref 78.0–100.0)
MPV: 11.2 fL (ref 8.6–12.4)
Platelets: 201 10*3/uL (ref 150–400)
RBC: 3.65 MIL/uL — ABNORMAL LOW (ref 3.87–5.11)
RDW: 13.2 % (ref 11.5–15.5)
WBC: 6.9 10*3/uL (ref 4.0–10.5)

## 2014-08-28 LAB — GLUCOSE TOLERANCE, 1 HOUR (50G) W/O FASTING: GLUCOSE 1 HOUR GTT: 141 mg/dL — AB (ref 70–140)

## 2014-08-28 LAB — HIV ANTIBODY (ROUTINE TESTING W REFLEX): HIV 1&2 Ab, 4th Generation: NONREACTIVE

## 2014-08-28 LAB — RPR

## 2014-08-31 ENCOUNTER — Telehealth: Payer: Self-pay

## 2014-08-31 NOTE — Telephone Encounter (Signed)
Per pacific interpreter no [place] interpreter available. Lab appointment scheduled for 09/03/14 at 0800. Called Interpreter Riffat Hussain at [phone] who then called patient on another line. No answer. Left message stating we are trying to reach you regarding and appointment, please call clinic.

## 2014-08-31 NOTE — Telephone Encounter (Signed)
-----   Message from Marlis EdelsonWalidah N Karim, [place] sent at 08/28/2014  2:24 PM EDT ----- Regarding: Need 3 hr test Please notify patient to come in for 3 hr GTT.

## 2014-09-01 NOTE — Telephone Encounter (Signed)
Attempted to contact patient again-- interpreter Riffat Eula ListenHussain reports she needs permission from language resources. No interpreter with pacific interpreter available. Will schedule patient's 3hr for this Friday and send letter with appointment information. Called patient's EC Ajmad-- whom is her husband and speaks clear english. Informed him of appointment date and time and instructions for patient to be fasting-- requests to have Thursday appoointment at 0800. Informed him we could do this. Verbalized understanding and gratitude. No further questions or concerns. Letter not sent.

## 2014-09-03 ENCOUNTER — Ambulatory Visit: Payer: Medicaid Other | Admitting: *Deleted

## 2014-09-03 DIAGNOSIS — Z3492 Encounter for supervision of normal pregnancy, unspecified, second trimester: Secondary | ICD-10-CM

## 2014-09-04 LAB — GLUCOSE TOLERANCE, 3 HOURS
GLUCOSE, FASTING-GESTATIONAL: 78 mg/dL (ref 70–104)
Glucose Tolerance, 1 hour: 158 mg/dL (ref 70–189)
Glucose Tolerance, 2 hour: 85 mg/dL (ref 70–164)
Glucose, GTT - 3 Hour: 96 mg/dL (ref 70–144)

## 2014-09-10 ENCOUNTER — Encounter: Payer: Self-pay | Admitting: Obstetrics and Gynecology

## 2014-09-10 ENCOUNTER — Ambulatory Visit (INDEPENDENT_AMBULATORY_CARE_PROVIDER_SITE_OTHER): Payer: Medicaid Other | Admitting: Obstetrics and Gynecology

## 2014-09-10 VITALS — BP 111/71 | HR 91 | Wt 141.0 lb

## 2014-09-10 DIAGNOSIS — B373 Candidiasis of vulva and vagina: Secondary | ICD-10-CM

## 2014-09-10 DIAGNOSIS — B3731 Acute candidiasis of vulva and vagina: Secondary | ICD-10-CM

## 2014-09-10 DIAGNOSIS — O98813 Other maternal infectious and parasitic diseases complicating pregnancy, third trimester: Secondary | ICD-10-CM

## 2014-09-10 DIAGNOSIS — Z3493 Encounter for supervision of normal pregnancy, unspecified, third trimester: Secondary | ICD-10-CM

## 2014-09-10 LAB — POCT URINALYSIS DIP (DEVICE)
Bilirubin Urine: NEGATIVE
Glucose, UA: NEGATIVE mg/dL
Hgb urine dipstick: NEGATIVE
Ketones, ur: NEGATIVE mg/dL
Nitrite: NEGATIVE
PH: 7 (ref 5.0–8.0)
Protein, ur: NEGATIVE mg/dL
Specific Gravity, Urine: 1.02 (ref 1.005–1.030)
Urobilinogen, UA: 0.2 mg/dL (ref 0.0–1.0)

## 2014-09-10 NOTE — Progress Notes (Signed)
Patient is doing well without complaints. FM/PTL precautions reviewed. Patient remains undecided regarding TOLAC. Needs to discuss further with her husband

## 2014-09-24 ENCOUNTER — Ambulatory Visit (INDEPENDENT_AMBULATORY_CARE_PROVIDER_SITE_OTHER): Payer: Medicaid Other | Admitting: Obstetrics & Gynecology

## 2014-09-24 VITALS — BP 117/70 | HR 92 | Temp 98.2°F | Wt 142.6 lb

## 2014-09-24 DIAGNOSIS — Z3493 Encounter for supervision of normal pregnancy, unspecified, third trimester: Secondary | ICD-10-CM

## 2014-09-24 DIAGNOSIS — O99613 Diseases of the digestive system complicating pregnancy, third trimester: Secondary | ICD-10-CM

## 2014-09-24 DIAGNOSIS — K219 Gastro-esophageal reflux disease without esophagitis: Secondary | ICD-10-CM

## 2014-09-24 LAB — POCT URINALYSIS DIP (DEVICE)
BILIRUBIN URINE: NEGATIVE
GLUCOSE, UA: NEGATIVE mg/dL
HGB URINE DIPSTICK: NEGATIVE
Ketones, ur: NEGATIVE mg/dL
Nitrite: NEGATIVE
Protein, ur: NEGATIVE mg/dL
Urobilinogen, UA: 0.2 mg/dL (ref 0.0–1.0)
pH: 6 (ref 5.0–8.0)

## 2014-09-24 MED ORDER — FAMOTIDINE 40 MG PO TABS: 40.0000 mg | ORAL_TABLET | Freq: Every day | ORAL | Status: DC

## 2014-09-24 NOTE — Progress Notes (Signed)
Pt has decided to have a TOLAC.  Needs to sign VBAC consent and scan under media.  Husband provided the interpretation for the form. Fundal height not grown in 2 weeks and looks small.  Will get US for growth.

## 2014-09-24 NOTE — Progress Notes (Signed)
Trace leuks in urine.  Requests refill of medication for heartburn-- pepcid prescribed.

## 2014-09-24 NOTE — Addendum Note (Signed)
Addended by: Louanna RawAMPBELL, Malea Swilling M on: 09/24/2014 12:51 PM   Modules accepted: Orders

## 2014-09-28 ENCOUNTER — Ambulatory Visit (HOSPITAL_COMMUNITY)
Admission: RE | Admit: 2014-09-28 | Discharge: 2014-09-28 | Disposition: A | Discharge status: Home / Self Care | Payer: Medicaid Other | Source: Ambulatory Visit | Attending: Obstetrics & Gynecology | Admitting: Obstetrics & Gynecology

## 2014-09-28 ENCOUNTER — Other Ambulatory Visit: Payer: Self-pay | Admitting: Obstetrics & Gynecology

## 2014-09-28 DIAGNOSIS — O365931 Maternal care for other known or suspected poor fetal growth, third trimester, fetus 1: Secondary | ICD-10-CM

## 2014-09-28 DIAGNOSIS — Z3493 Encounter for supervision of normal pregnancy, unspecified, third trimester: Secondary | ICD-10-CM

## 2014-09-28 DIAGNOSIS — O365930 Maternal care for other known or suspected poor fetal growth, third trimester, not applicable or unspecified: Secondary | ICD-10-CM | POA: Insufficient documentation

## 2014-09-29 ENCOUNTER — Encounter: Payer: Self-pay | Admitting: Obstetrics & Gynecology

## 2014-09-29 ENCOUNTER — Encounter: Payer: Self-pay | Admitting: *Deleted

## 2014-09-29 DIAGNOSIS — O359XX0 Maternal care for (suspected) fetal abnormality and damage, unspecified, not applicable or unspecified: Secondary | ICD-10-CM | POA: Insufficient documentation

## 2014-10-01 ENCOUNTER — Other Ambulatory Visit: Payer: Self-pay | Admitting: Obstetrics & Gynecology

## 2014-10-01 DIAGNOSIS — O359XX1 Maternal care for (suspected) fetal abnormality and damage, unspecified, fetus 1: Secondary | ICD-10-CM

## 2014-10-06 NOTE — Progress Notes (Signed)
Fetal echo scheduled with [facility] for 6/3 @ 11am with Dr Clent RidgesWalsh. Address is [address], first floor of building. Phone number [phone]. Will inform patient at appt on 5/26.

## 2014-10-08 ENCOUNTER — Encounter: Payer: Self-pay | Admitting: Obstetrics and Gynecology

## 2014-10-08 ENCOUNTER — Ambulatory Visit (INDEPENDENT_AMBULATORY_CARE_PROVIDER_SITE_OTHER): Payer: Medicaid Other | Admitting: Obstetrics and Gynecology

## 2014-10-08 VITALS — BP 120/80 | HR 106 | Temp 98.3°F | Wt 145.5 lb

## 2014-10-08 DIAGNOSIS — O34219 Maternal care for unspecified type scar from previous cesarean delivery: Secondary | ICD-10-CM

## 2014-10-08 DIAGNOSIS — O359XX1 Maternal care for (suspected) fetal abnormality and damage, unspecified, fetus 1: Secondary | ICD-10-CM

## 2014-10-08 DIAGNOSIS — O3421 Maternal care for scar from previous cesarean delivery: Secondary | ICD-10-CM

## 2014-10-08 DIAGNOSIS — Z3493 Encounter for supervision of normal pregnancy, unspecified, third trimester: Secondary | ICD-10-CM

## 2014-10-08 LAB — POCT URINALYSIS DIP (DEVICE)
BILIRUBIN URINE: NEGATIVE
Glucose, UA: NEGATIVE mg/dL
HGB URINE DIPSTICK: NEGATIVE
KETONES UR: NEGATIVE mg/dL
Nitrite: NEGATIVE
PROTEIN: NEGATIVE mg/dL
SPECIFIC GRAVITY, URINE: 1.02 (ref 1.005–1.030)
UROBILINOGEN UA: 0.2 mg/dL (ref 0.0–1.0)
pH: 5.5 (ref 5.0–8.0)

## 2014-10-08 NOTE — Progress Notes (Signed)
Patient is doing well without complaints. The described lower back pain is consistent with 3rd trimester discomfort. FM/PTL precautions reviewed. EFW 50%tile on recent scan

## 2014-10-08 NOTE — Progress Notes (Signed)
Patient informed about fetal echo date, time and location of appointment.

## 2014-10-08 NOTE — Progress Notes (Signed)
C/o intermittent pelvic pain/pressure and lower back pain.  Riffat Hussein used as interpreter for this encounter.  Moderate leuks noted in urine.

## 2014-10-28 ENCOUNTER — Encounter: Payer: Self-pay | Admitting: *Deleted

## 2014-10-29 ENCOUNTER — Ambulatory Visit (INDEPENDENT_AMBULATORY_CARE_PROVIDER_SITE_OTHER): Payer: Medicaid Other | Admitting: Family

## 2014-10-29 VITALS — BP 122/79 | HR 100 | Temp 98.4°F | Wt 146.7 lb

## 2014-10-29 DIAGNOSIS — O3421 Maternal care for scar from previous cesarean delivery: Secondary | ICD-10-CM

## 2014-10-29 DIAGNOSIS — O34219 Maternal care for unspecified type scar from previous cesarean delivery: Secondary | ICD-10-CM

## 2014-10-29 LAB — POCT URINALYSIS DIP (DEVICE)
Bilirubin Urine: NEGATIVE
Glucose, UA: NEGATIVE mg/dL
Ketones, ur: NEGATIVE mg/dL
Nitrite: NEGATIVE
PH: 6.5 (ref 5.0–8.0)
PROTEIN: NEGATIVE mg/dL
SPECIFIC GRAVITY, URINE: 1.025 (ref 1.005–1.030)
Urobilinogen, UA: 0.2 mg/dL (ref 0.0–1.0)

## 2014-10-29 LAB — OB RESULTS CONSOLE GBS: GBS: NEGATIVE

## 2014-10-29 LAB — OB RESULTS CONSOLE GC/CHLAMYDIA
Chlamydia: NEGATIVE
Gonorrhea: NEGATIVE

## 2014-10-29 MED ORDER — HYDROCORTISONE 1 % EX OINT: 1.0000 "application " | TOPICAL_OINTMENT | Freq: Two times a day (BID) | CUTANEOUS | Status: DC

## 2014-10-29 MED ORDER — NITROFURANTOIN MONOHYD MACRO 100 MG PO CAPS: 100.0000 mg | ORAL_CAPSULE | Freq: Two times a day (BID) | ORAL | Status: DC

## 2014-10-29 NOTE — Progress Notes (Signed)
Reports rash on arms and some on right leg x 2 months.  +itching, no itching on hands or feet.  Exam - +eczema appearance.  No fever.  Used ointment prescribed before with some relief.  RX hydrocortisone oint.  Mod leuks in urine, back pain.  No dysuria.  RX Macrobid, urine culture sent.

## 2014-10-29 NOTE — Progress Notes (Signed)
Used Public librarian. . C/o rash on both arms. C/o lower back pain all the time and body aches at nighttime  After breaks fast. Is fasting 4;15 am until 8:38pm. Urinalyis shows trace blood and moderate leukocytes. Sent for culture.

## 2014-10-30 LAB — CULTURE, OB URINE
Colony Count: NO GROWTH
ORGANISM ID, BACTERIA: NO GROWTH

## 2014-10-30 LAB — GC/CHLAMYDIA PROBE AMP
CT PROBE, AMP APTIMA: NEGATIVE
GC PROBE AMP APTIMA: NEGATIVE

## 2014-10-31 LAB — CULTURE, BETA STREP (GROUP B ONLY)

## 2014-11-04 ENCOUNTER — Ambulatory Visit (INDEPENDENT_AMBULATORY_CARE_PROVIDER_SITE_OTHER): Payer: Medicaid Other | Admitting: Obstetrics and Gynecology

## 2014-11-04 ENCOUNTER — Encounter: Payer: Self-pay | Admitting: Obstetrics and Gynecology

## 2014-11-04 VITALS — BP 109/72 | HR 85 | Temp 98.2°F | Wt 148.1 lb

## 2014-11-04 DIAGNOSIS — O34219 Maternal care for unspecified type scar from previous cesarean delivery: Secondary | ICD-10-CM

## 2014-11-04 DIAGNOSIS — O359XX1 Maternal care for (suspected) fetal abnormality and damage, unspecified, fetus 1: Secondary | ICD-10-CM | POA: Diagnosis not present

## 2014-11-04 DIAGNOSIS — O3421 Maternal care for scar from previous cesarean delivery: Secondary | ICD-10-CM | POA: Diagnosis present

## 2014-11-04 DIAGNOSIS — Z3493 Encounter for supervision of normal pregnancy, unspecified, third trimester: Secondary | ICD-10-CM | POA: Diagnosis not present

## 2014-11-04 LAB — POCT URINALYSIS DIP (DEVICE)
Bilirubin Urine: NEGATIVE
Glucose, UA: NEGATIVE mg/dL
Ketones, ur: NEGATIVE mg/dL
NITRITE: NEGATIVE
PROTEIN: NEGATIVE mg/dL
SPECIFIC GRAVITY, URINE: 1.02 (ref 1.005–1.030)
UROBILINOGEN UA: 0.2 mg/dL (ref 0.0–1.0)
pH: 7 (ref 5.0–8.0)

## 2014-11-04 NOTE — Progress Notes (Signed)
Resident note: met with [name] to say hello and introduce myself for continuity of care, as I will try to attend her delivery should she deliver in the next week. Samantha Rio, MD

## 2014-11-04 NOTE — Progress Notes (Signed)
Subjective:  Samantha [name] is a 29 y.o. G2P1001 at [redacted]w[redacted]d being seen today for ongoing prenatal care.  Patient reports backache.  Contractions: Irregular.  Vag. Bleeding: None. Movement: Present. Denies leaking of fluid.   The following portions of the patient's history were reviewed and updated as appropriate: allergies, current medications, past family history, past medical history, past social history, past surgical history and problem list.   Objective:   Filed Vitals:   11/04/14 1120  BP: 109/72  Pulse: 85  Temp: 98.2 F (36.8 C)  Weight: 148 lb 1.6 oz (67.178 kg)    Fetal Status:     Movement: Present     General:  Alert, oriented and cooperative. Patient is in no acute distress.  Skin: Skin is warm and dry. No rash noted.   Cardiovascular: Normal heart rate noted  Respiratory: Effort and breath sounds normal, no problems with respiration noted  Abdomen: Soft, gravid, appropriate for gestational age. Pain/Pressure: Present     Vaginal: Vag. Bleeding: None.       Cervix: Not evaluated        Extremities: Normal range of motion.  Edema: None  Mental Status: Normal mood and affect. Normal behavior. Normal judgment and thought content.   Urinalysis:      Assessment and Plan:  Pregnancy: G2P1001 at [redacted]w[redacted]d  1. Previous cesarean delivery, antepartum Patient desires TOLAC  2. Known or suspected fetal anomaly affecting care of mother, antepartum, fetus 1   3. Supervision of normal pregnancy, third trimester Patient is doing well without complaints.    Term labor symptoms and general obstetric precautions including but not limited to vaginal bleeding, contractions, leaking of fluid and fetal movement were reviewed in detail with the patient.  Please refer to After Visit Summary for other counseling recommendations.   Return in about 1 week (around 11/11/2014).   Catalina Antigua, MD

## 2014-11-04 NOTE — Progress Notes (Signed)
Interpreter Riffat Eula Listen present for this encounter.

## 2014-11-08 ENCOUNTER — Encounter (HOSPITAL_COMMUNITY): Payer: Self-pay | Admitting: *Deleted

## 2014-11-08 ENCOUNTER — Inpatient Hospital Stay (HOSPITAL_COMMUNITY)
Admission: AD | Admit: 2014-11-08 | Discharge: 2014-11-08 | Disposition: A | Discharge status: Home / Self Care | Payer: Medicaid Other | Source: Ambulatory Visit | Attending: Obstetrics & Gynecology | Admitting: Obstetrics & Gynecology

## 2014-11-08 DIAGNOSIS — Z3493 Encounter for supervision of normal pregnancy, unspecified, third trimester: Secondary | ICD-10-CM

## 2014-11-08 DIAGNOSIS — Z3A38 38 weeks gestation of pregnancy: Secondary | ICD-10-CM | POA: Diagnosis not present

## 2014-11-08 DIAGNOSIS — O99613 Diseases of the digestive system complicating pregnancy, third trimester: Secondary | ICD-10-CM | POA: Diagnosis not present

## 2014-11-08 DIAGNOSIS — M549 Dorsalgia, unspecified: Secondary | ICD-10-CM | POA: Diagnosis not present

## 2014-11-08 DIAGNOSIS — O9989 Other specified diseases and conditions complicating pregnancy, childbirth and the puerperium: Secondary | ICD-10-CM | POA: Diagnosis not present

## 2014-11-08 DIAGNOSIS — K219 Gastro-esophageal reflux disease without esophagitis: Secondary | ICD-10-CM | POA: Diagnosis not present

## 2014-11-08 DIAGNOSIS — O34219 Maternal care for unspecified type scar from previous cesarean delivery: Secondary | ICD-10-CM

## 2014-11-08 DIAGNOSIS — M545 Low back pain: Secondary | ICD-10-CM | POA: Diagnosis present

## 2014-11-08 NOTE — MAU Provider Note (Signed)
  History   G2P1 @ 38.1 wks in with c/o low back pain and heartburn that has been going on for weeks. States has active 29 yo at home and stays busy caring for that child.  CSN: [id number]  Arrival date and time: 11/08/14 5726   None     Chief Complaint  Patient presents with  . Abdominal Pain  . Back Pain   HPI  OB History    Gravida Para Term Preterm AB TAB SAB Ectopic Multiple Living   2 1 1       1       Past Medical History  Diagnosis Date  . Medical history non-contributory     Past Surgical History  Procedure Laterality Date  . Cesarean section      Family History  Problem Relation Age of Onset  . Asthma Father     History  Substance Use Topics  . Smoking status: Never Smoker   . Smokeless tobacco: Not on file  . Alcohol Use: No    Allergies: No Known Allergies  Prescriptions prior to admission  Medication Sig Dispense Refill Last Dose  . acetaminophen (TYLENOL) 500 MG tablet Take 500 mg by mouth every 6 (six) hours as needed.   11/07/2014 at Unknown time  . famotidine (PEPCID) 40 MG tablet Take 1 tablet (40 mg total) by mouth daily. 30 tablet 1 11/07/2014 at Unknown time  . Prenatal Vit-Fe Fumarate-FA (PRENATAL VITAMINS PLUS) 27-1 MG TABS Take 1 tablet by mouth.   11/07/2014 at Unknown time  . hydrocortisone 1 % ointment Apply 1 application topically 2 (two) times daily. (Patient not taking: Reported on 11/08/2014) 30 g 0 Not Taking at Unknown time    Review of Systems  Constitutional: Negative.   HENT: Negative.   Eyes: Negative.   Respiratory: Negative.   Cardiovascular: Negative.   Genitourinary: Negative.   Musculoskeletal: Positive for back pain.  Skin: Negative.   Neurological: Negative.    Physical Exam   Blood pressure 115/76, pulse 89, temperature 98.1 F (36.7 C), temperature source Oral, resp. rate 18, height 5\' 2"  (1.575 m), weight 147 lb 9.6 oz (66.951 kg), last menstrual period 02/24/2014.  Physical Exam  Constitutional: She is  oriented to person, place, and time. She appears well-nourished.  HENT:  Head: Normocephalic.  Eyes: Pupils are equal, round, and reactive to light.  Neck: Normal range of motion.  Cardiovascular: Normal rate, regular rhythm, normal heart sounds and intact distal pulses.   Respiratory: Effort normal and breath sounds normal.  GI: Soft. Bowel sounds are normal.  Genitourinary: Vagina normal and uterus normal.  Musculoskeletal: Normal range of motion.  Neurological: She is alert and oriented to person, place, and time. She has normal reflexes.  Skin: Skin is warm and dry.  Psychiatric: She has a normal mood and affect. Her behavior is normal. Judgment and thought content normal.    MAU Course  Procedures  MDM D/c home  Assessment and Plan  Normal discomfort of pregnancy, GERD. Zantac po. D/c home  Samantha [name] DARLENE 11/08/2014, 9:38 AM

## 2014-11-08 NOTE — Discharge Instructions (Signed)
Back Pain in Pregnancy °Back pain during pregnancy is common. It happens in about half of all pregnancies. It is important for you and your baby that you remain active during your pregnancy. If you feel that back pain is not allowing you to remain active or sleep well, it is time to see your caregiver. Back pain may be caused by several factors related to changes during your pregnancy. Fortunately, unless you had trouble with your back before your pregnancy, the pain is likely to get better after you deliver. °Low back pain usually occurs between the fifth and seventh months of pregnancy. It can, however, happen in the first couple months. Factors that increase the risk of back problems include:  °· Previous back problems. °· Injury to your back. °· Having twins or multiple births. °· A chronic cough. °· Stress. °· Job-related repetitive motions. °· Muscle or spinal disease in the back. °· Family history of back problems, ruptured (herniated) discs, or osteoporosis. °· Depression, anxiety, and panic attacks. °CAUSES  °· When you are pregnant, your body produces a hormone called relaxin. This hormone makes the ligaments connecting the low back and pubic bones more flexible. This flexibility allows the baby to be delivered more easily. When your ligaments are loose, your muscles need to work harder to support your back. Soreness in your back can come from tired muscles. Soreness can also come from back tissues that are irritated since they are receiving less support. °· As the baby grows, it puts pressure on the nerves and blood vessels in your pelvis. This can cause back pain. °· As the baby grows and gets heavier during pregnancy, the uterus pushes the stomach muscles forward and changes your center of gravity. This makes your back muscles work harder to maintain good posture. °SYMPTOMS  °Lumbar pain during pregnancy °Lumbar pain during pregnancy usually occurs at or above the waist in the center of the back. There  may be pain and numbness that radiates into your leg or foot. This is similar to low back pain experienced by non-pregnant women. It usually increases with sitting for long periods of time, standing, or repetitive lifting. Tenderness may also be present in the muscles along your upper back. °Posterior pelvic pain during pregnancy °Pain in the back of the pelvis is more common than lumbar pain in pregnancy. It is a deep pain felt in your side at the waistline, or across the tailbone (sacrum), or in both places. You may have pain on one or both sides. This pain can also go into the buttocks and backs of the upper thighs. Pubic and groin pain may also be present. The pain does not quickly resolve with rest, and morning stiffness may also be present. °Pelvic pain during pregnancy can be brought on by most activities. A high level of fitness before and during pregnancy may or may not prevent this problem. Labor pain is usually 1 to 2 minutes apart, lasts for about 1 minute, and involves a bearing down feeling or pressure in your pelvis. However, if you are at term with the pregnancy, constant low back pain can be the beginning of early labor, and you should be aware of this. °DIAGNOSIS  °X-rays of the back should not be done during the first 12 to 14 weeks of the pregnancy and only when absolutely necessary during the rest of the pregnancy. MRIs do not give off radiation and are safe during pregnancy. MRIs also should only be done when absolutely necessary. °HOME CARE INSTRUCTIONS °· Exercise   as directed by your caregiver. Exercise is the most effective way to prevent or manage back pain. If you have a back problem, it is especially important to avoid sports that require sudden body movements. Swimming and walking are great activities. °· Do not stand in one place for long periods of time. °· Do not wear high heels. °· Sit in chairs with good posture. Use a pillow on your lower back if necessary. Make sure your head  rests over your shoulders and is not hanging forward. °· Try sleeping on your side, preferably the left side, with a pillow or two between your legs. If you are sore after a night's rest, your bed may be too soft. Try placing a board between your mattress and box spring. °· Listen to your body when lifting. If you are experiencing pain, ask for help or try bending your knees more so you can use your leg muscles rather than your back muscles. Squat down when picking up something from the floor. Do not bend over. °· Eat a healthy diet. Try to gain weight within your caregiver's recommendations. °· Use heat or cold packs 3 to 4 times a day for 15 minutes to help with the pain. °· Only take over-the-counter or prescription medicines for pain, discomfort, or fever as directed by your caregiver. °Sudden (acute) back pain °· Use bed rest for only the most extreme, acute episodes of back pain. Prolonged bed rest over 48 hours will aggravate your condition. °· Ice is very effective for acute conditions. °¨ Put ice in a plastic bag. °¨ Place a towel between your skin and the bag. °¨ Leave the ice on for 10 to 20 minutes every 2 hours, or as needed. °· Using heat packs for 30 minutes prior to activities is also helpful. °Continued back pain °See your caregiver if you have continued problems. Your caregiver can help or refer you for appropriate physical therapy. With conditioning, most back problems can be avoided. Sometimes, a more serious issue may be the cause of back pain. You should be seen right away if new problems seem to be developing. Your caregiver may recommend: °· A maternity girdle. °· An elastic sling. °· A back brace. °· A massage therapist or acupuncture. °SEEK MEDICAL CARE IF:  °· You are not able to do most of your daily activities, even when taking the pain medicine you were given. °· You need a referral to a physical therapist or chiropractor. °· You want to try acupuncture. °SEEK IMMEDIATE MEDICAL CARE  IF: °· You develop numbness, tingling, weakness, or problems with the use of your arms or legs. °· You develop severe back pain that is no longer relieved with medicines. °· You have a sudden change in bowel or bladder control. °· You have increasing pain in other areas of the body. °· You develop shortness of breath, dizziness, or fainting. °· You develop nausea, vomiting, or sweating. °· You have back pain which is similar to labor pains. °· You have back pain along with your water breaking or vaginal bleeding. °· You have back pain or numbness that travels down your leg. °· Your back pain developed after you fell. °· You develop pain on one side of your back. You may have a kidney stone. °· You see blood in your urine. You may have a bladder infection or kidney stone. °· You have back pain with blisters. You may have shingles. °Back pain is fairly common during pregnancy but should not be accepted as just part of   the process. Back pain should always be treated as soon as possible. This will make your pregnancy as pleasant as possible. °Document Released: 08/09/2005 Document Revised: 07/24/2011 Document Reviewed: 09/20/2010 °[employer]® Patient Information ©2015 [employer]. This information is not intended to replace advice given to you by your health care provider. Make sure you discuss any questions you have with your health care provider. °Gastroesophageal Reflux Disease, Adult °Gastroesophageal reflux disease (GERD) happens when acid from your stomach flows up into the esophagus. When acid comes in contact with the esophagus, the acid causes soreness (inflammation) in the esophagus. Over time, GERD may create small holes (ulcers) in the lining of the esophagus. °CAUSES  °· Increased body weight. This puts pressure on the stomach, making acid rise from the stomach into the esophagus. °· Smoking. This increases acid production in the stomach. °· Drinking alcohol. This causes decreased pressure in the lower  esophageal sphincter (valve or ring of muscle between the esophagus and stomach), allowing acid from the stomach into the esophagus. °· Late evening meals and a full stomach. This increases pressure and acid production in the stomach. °· A malformed lower esophageal sphincter. °Sometimes, no cause is found. °SYMPTOMS  °· Burning pain in the lower part of the mid-chest behind the breastbone and in the mid-stomach area. This may occur twice a week or more often. °· Trouble swallowing. °· Sore throat. °· Dry cough. °· Asthma-like symptoms including chest tightness, shortness of breath, or wheezing. °DIAGNOSIS  °Your caregiver may be able to diagnose GERD based on your symptoms. In some cases, X-rays and other tests may be done to check for complications or to check the condition of your stomach and esophagus. °TREATMENT  °Your caregiver may recommend over-the-counter or prescription medicines to help decrease acid production. Ask your caregiver before starting or adding any new medicines.  °HOME CARE INSTRUCTIONS  °· Change the factors that you can control. Ask your caregiver for guidance concerning weight loss, quitting smoking, and alcohol consumption. °· Avoid foods and drinks that make your symptoms worse, such as: °¨ Caffeine or alcoholic drinks. °¨ Chocolate. °¨ Peppermint or mint flavorings. °¨ Garlic and onions. °¨ Spicy foods. °¨ Citrus fruits, such as oranges, lemons, or limes. °¨ Tomato-based foods such as sauce, chili, salsa, and pizza. °¨ Fried and fatty foods. °· Avoid lying down for the 3 hours prior to your bedtime or prior to taking a nap. °· Eat small, frequent meals instead of large meals. °· Wear loose-fitting clothing. Do not wear anything tight around your waist that causes pressure on your stomach. °· Raise the head of your bed 6 to 8 inches with wood blocks to help you sleep. Extra pillows will not help. °· Only take over-the-counter or prescription medicines for pain, discomfort, or fever as  directed by your caregiver. °· Do not take aspirin, ibuprofen, or other nonsteroidal anti-inflammatory drugs (NSAIDs). °SEEK IMMEDIATE MEDICAL CARE IF:  °· You have pain in your arms, neck, jaw, teeth, or back. °· Your pain increases or changes in intensity or duration. °· You develop nausea, vomiting, or sweating (diaphoresis). °· You develop shortness of breath, or you faint. °· Your vomit is green, yellow, black, or looks like coffee grounds or blood. °· Your stool is red, bloody, or black. °These symptoms could be signs of other problems, such as heart disease, gastric bleeding, or esophageal bleeding. °MAKE SURE YOU:  °· Understand these instructions. °· Will watch your condition. °· Will get help right away if you are   not doing well or get worse. Document Released: 02/08/2005 Document Revised: 07/24/2011 Document Reviewed: 11/18/2010 [facility] Patient Information 2015 [place], [place]. This information is not intended to replace advice given to you by your health care provider. Make sure you discuss any questions you have with your health care provider.

## 2014-11-08 NOTE — MAU Note (Signed)
Back pain started yesterday and lasted through the night.  Still having back pain this morning and now has abdominal pain with it. Denies LOF/VB.

## 2014-11-11 ENCOUNTER — Ambulatory Visit (INDEPENDENT_AMBULATORY_CARE_PROVIDER_SITE_OTHER): Payer: Medicaid Other | Admitting: Advanced Practice Midwife

## 2014-11-11 ENCOUNTER — Encounter: Payer: Self-pay | Admitting: Advanced Practice Midwife

## 2014-11-11 VITALS — BP 120/77 | HR 96 | Temp 98.7°F | Wt 146.3 lb

## 2014-11-11 DIAGNOSIS — O3421 Maternal care for scar from previous cesarean delivery: Secondary | ICD-10-CM

## 2014-11-11 DIAGNOSIS — O34219 Maternal care for unspecified type scar from previous cesarean delivery: Secondary | ICD-10-CM

## 2014-11-11 LAB — POCT URINALYSIS DIP (DEVICE)
BILIRUBIN URINE: NEGATIVE
Glucose, UA: NEGATIVE mg/dL
Hgb urine dipstick: NEGATIVE
Ketones, ur: NEGATIVE mg/dL
Nitrite: NEGATIVE
PH: 5.5 (ref 5.0–8.0)
PROTEIN: NEGATIVE mg/dL
Specific Gravity, Urine: 1.01 (ref 1.005–1.030)
Urobilinogen, UA: 0.2 mg/dL (ref 0.0–1.0)

## 2014-11-11 NOTE — Patient Instructions (Signed)

## 2014-11-11 NOTE — Progress Notes (Signed)
Starting to have some contractions. Some painful. Reviewed signs of labor and where to come. Medical, Surgical, Family and Social histories reviewed and are listed above.  Medications and allergies reviewed. Interpretor was present

## 2014-11-11 NOTE — Progress Notes (Signed)
Used Interpreter [employer].  Pt. States taking last 2 macrobid.

## 2014-11-19 ENCOUNTER — Inpatient Hospital Stay (HOSPITAL_COMMUNITY)
Admission: AD | Admit: 2014-11-19 | Discharge: 2014-11-21 | DRG: 775 | Disposition: A | Discharge status: Home / Self Care | Payer: Medicaid Other | Source: Ambulatory Visit | Attending: Family Medicine | Admitting: Family Medicine

## 2014-11-19 ENCOUNTER — Encounter (HOSPITAL_COMMUNITY): Payer: Self-pay

## 2014-11-19 ENCOUNTER — Inpatient Hospital Stay (HOSPITAL_COMMUNITY): Payer: Medicaid Other | Admitting: Anesthesiology

## 2014-11-19 DIAGNOSIS — O9962 Diseases of the digestive system complicating childbirth: Secondary | ICD-10-CM | POA: Diagnosis present

## 2014-11-19 DIAGNOSIS — K219 Gastro-esophageal reflux disease without esophagitis: Secondary | ICD-10-CM | POA: Diagnosis present

## 2014-11-19 DIAGNOSIS — O34219 Maternal care for unspecified type scar from previous cesarean delivery: Secondary | ICD-10-CM

## 2014-11-19 DIAGNOSIS — Z3A39 39 weeks gestation of pregnancy: Secondary | ICD-10-CM | POA: Diagnosis present

## 2014-11-19 DIAGNOSIS — O3421 Maternal care for scar from previous cesarean delivery: Secondary | ICD-10-CM | POA: Diagnosis present

## 2014-11-19 DIAGNOSIS — O4292 Full-term premature rupture of membranes, unspecified as to length of time between rupture and onset of labor: Secondary | ICD-10-CM | POA: Diagnosis present

## 2014-11-19 DIAGNOSIS — O4290 Premature rupture of membranes, unspecified as to length of time between rupture and onset of labor, unspecified weeks of gestation: Secondary | ICD-10-CM | POA: Diagnosis present

## 2014-11-19 LAB — RPR: RPR Ser Ql: NONREACTIVE

## 2014-11-19 LAB — TYPE AND SCREEN
ABO/RH(D): O POS
ANTIBODY SCREEN: NEGATIVE

## 2014-11-19 LAB — CBC
HCT: 37.9 % (ref 36.0–46.0)
Hemoglobin: 13 g/dL (ref 12.0–15.0)
MCH: 31.9 pg (ref 26.0–34.0)
MCHC: 34.3 g/dL (ref 30.0–36.0)
MCV: 92.9 fL (ref 78.0–100.0)
PLATELETS: 159 10*3/uL (ref 150–400)
RBC: 4.08 MIL/uL (ref 3.87–5.11)
RDW: 12.6 % (ref 11.5–15.5)
WBC: 7.8 10*3/uL (ref 4.0–10.5)

## 2014-11-19 LAB — HIV ANTIBODY (ROUTINE TESTING W REFLEX): HIV SCREEN 4TH GENERATION: NONREACTIVE

## 2014-11-19 LAB — AMNISURE RUPTURE OF MEMBRANE (ROM) NOT AT ARMC: AMNISURE: POSITIVE

## 2014-11-19 LAB — ABO/RH: ABO/RH(D): O POS

## 2014-11-19 MED ORDER — PRENATAL MULTIVITAMIN CH
1.0000 | ORAL_TABLET | Freq: Every day | ORAL | Status: DC
  Administered 2014-11-20: 1 via ORAL
  Filled 2014-11-19: qty 1

## 2014-11-19 MED ORDER — LANOLIN HYDROUS EX OINT: TOPICAL_OINTMENT | CUTANEOUS | Status: DC | PRN

## 2014-11-19 MED ORDER — ONDANSETRON HCL 4 MG PO TABS: 4.0000 mg | ORAL_TABLET | ORAL | Status: DC | PRN

## 2014-11-19 MED ORDER — EPHEDRINE 5 MG/ML INJ
10.0000 mg | INTRAVENOUS | Status: DC | PRN
  Filled 2014-11-19: qty 2

## 2014-11-19 MED ORDER — OXYCODONE-ACETAMINOPHEN 5-325 MG PO TABS: 2.0000 | ORAL_TABLET | ORAL | Status: DC | PRN

## 2014-11-19 MED ORDER — OXYCODONE-ACETAMINOPHEN 5-325 MG PO TABS
1.0000 | ORAL_TABLET | Freq: Once | ORAL | Status: AC
  Administered 2014-11-19: 1 via ORAL
  Filled 2014-11-19: qty 1

## 2014-11-19 MED ORDER — ZOLPIDEM TARTRATE 5 MG PO TABS: 5.0000 mg | ORAL_TABLET | Freq: Every evening | ORAL | Status: DC | PRN

## 2014-11-19 MED ORDER — FENTANYL CITRATE (PF) 100 MCG/2ML IJ SOLN
100.0000 ug | INTRAMUSCULAR | Status: DC | PRN
  Administered 2014-11-19 (×2): 100 ug via INTRAVENOUS
  Filled 2014-11-19 (×2): qty 2

## 2014-11-19 MED ORDER — PHENYLEPHRINE 40 MCG/ML (10ML) SYRINGE FOR IV PUSH (FOR BLOOD PRESSURE SUPPORT): 80.0000 ug | PREFILLED_SYRINGE | INTRAVENOUS | Status: DC | PRN

## 2014-11-19 MED ORDER — FENTANYL 2.5 MCG/ML BUPIVACAINE 1/10 % EPIDURAL INFUSION (WH - ANES)
12.0000 mL/h | INTRAMUSCULAR | Status: DC | PRN
  Administered 2014-11-19 (×2): 12 mL/h via EPIDURAL

## 2014-11-19 MED ORDER — PHENYLEPHRINE 40 MCG/ML (10ML) SYRINGE FOR IV PUSH (FOR BLOOD PRESSURE SUPPORT)
80.0000 ug | PREFILLED_SYRINGE | INTRAVENOUS | Status: DC | PRN
  Filled 2014-11-19: qty 20
  Filled 2014-11-19: qty 2

## 2014-11-19 MED ORDER — CITRIC ACID-SODIUM CITRATE 334-500 MG/5ML PO SOLN: 30.0000 mL | ORAL | Status: DC | PRN

## 2014-11-19 MED ORDER — ACETAMINOPHEN 325 MG PO TABS
650.0000 mg | ORAL_TABLET | ORAL | Status: DC | PRN
  Administered 2014-11-20: 650 mg via ORAL
  Filled 2014-11-19: qty 2

## 2014-11-19 MED ORDER — LACTATED RINGERS IV SOLN: 500.0000 mL | INTRAVENOUS | Status: DC | PRN

## 2014-11-19 MED ORDER — FENTANYL 2.5 MCG/ML BUPIVACAINE 1/10 % EPIDURAL INFUSION (WH - ANES)
14.0000 mL/h | INTRAMUSCULAR | Status: DC | PRN
  Filled 2014-11-19: qty 125

## 2014-11-19 MED ORDER — OXYTOCIN BOLUS FROM INFUSION
500.0000 mL | INTRAVENOUS | Status: DC
  Administered 2014-11-19: 500 mL via INTRAVENOUS

## 2014-11-19 MED ORDER — DIBUCAINE 1 % RE OINT: 1.0000 "application " | TOPICAL_OINTMENT | RECTAL | Status: DC | PRN

## 2014-11-19 MED ORDER — ONDANSETRON HCL 4 MG/2ML IJ SOLN: 4.0000 mg | Freq: Four times a day (QID) | INTRAMUSCULAR | Status: DC | PRN

## 2014-11-19 MED ORDER — LACTATED RINGERS IV SOLN
INTRAVENOUS | Status: DC
  Administered 2014-11-19 (×3): via INTRAVENOUS

## 2014-11-19 MED ORDER — DIPHENHYDRAMINE HCL 50 MG/ML IJ SOLN: 12.5000 mg | INTRAMUSCULAR | Status: DC | PRN

## 2014-11-19 MED ORDER — SENNOSIDES-DOCUSATE SODIUM 8.6-50 MG PO TABS
2.0000 | ORAL_TABLET | ORAL | Status: DC
  Administered 2014-11-19 – 2014-11-20 (×2): 2 via ORAL
  Filled 2014-11-19 (×3): qty 2

## 2014-11-19 MED ORDER — ONDANSETRON HCL 4 MG/2ML IJ SOLN: 4.0000 mg | INTRAMUSCULAR | Status: DC | PRN

## 2014-11-19 MED ORDER — WITCH HAZEL-GLYCERIN EX PADS: 1.0000 "application " | MEDICATED_PAD | CUTANEOUS | Status: DC | PRN

## 2014-11-19 MED ORDER — OXYCODONE-ACETAMINOPHEN 5-325 MG PO TABS: 1.0000 | ORAL_TABLET | ORAL | Status: DC | PRN

## 2014-11-19 MED ORDER — OXYTOCIN 40 UNITS IN LACTATED RINGERS INFUSION - SIMPLE MED
62.5000 mL/h | INTRAVENOUS | Status: DC
  Filled 2014-11-19: qty 1000

## 2014-11-19 MED ORDER — TETANUS-DIPHTH-ACELL PERTUSSIS 5-2.5-18.5 LF-MCG/0.5 IM SUSP: 0.5000 mL | Freq: Once | INTRAMUSCULAR | Status: DC

## 2014-11-19 MED ORDER — DIPHENHYDRAMINE HCL 25 MG PO CAPS: 25.0000 mg | ORAL_CAPSULE | Freq: Four times a day (QID) | ORAL | Status: DC | PRN

## 2014-11-19 MED ORDER — ACETAMINOPHEN 325 MG PO TABS: 650.0000 mg | ORAL_TABLET | ORAL | Status: DC | PRN

## 2014-11-19 MED ORDER — SIMETHICONE 80 MG PO CHEW: 80.0000 mg | CHEWABLE_TABLET | ORAL | Status: DC | PRN

## 2014-11-19 MED ORDER — BENZOCAINE-MENTHOL 20-0.5 % EX AERO
1.0000 "application " | INHALATION_SPRAY | CUTANEOUS | Status: DC | PRN
  Filled 2014-11-19: qty 56

## 2014-11-19 MED ORDER — IBUPROFEN 600 MG PO TABS
600.0000 mg | ORAL_TABLET | Freq: Four times a day (QID) | ORAL | Status: DC
  Administered 2014-11-19 – 2014-11-21 (×6): 600 mg via ORAL
  Filled 2014-11-19 (×7): qty 1

## 2014-11-19 MED ORDER — LIDOCAINE HCL (PF) 1 % IJ SOLN
30.0000 mL | INTRAMUSCULAR | Status: DC | PRN
  Administered 2014-11-19: 30 mL via SUBCUTANEOUS
  Filled 2014-11-19: qty 30

## 2014-11-19 MED ORDER — LIDOCAINE HCL (PF) 1 % IJ SOLN
INTRAMUSCULAR | Status: DC | PRN
  Administered 2014-11-19: 4 mL
  Administered 2014-11-19: 3 mL via EPIDURAL

## 2014-11-19 NOTE — Anesthesia Procedure Notes (Addendum)
Epidural Patient location during procedure: OB Start time: 11/19/2014 9:38 AM  Staffing Anesthesiologist: Mal AmabileFOSTER, Jelitza Manninen Performed by: anesthesiologist   Preanesthetic Checklist Completed: patient identified, site marked, surgical consent, pre-op evaluation, timeout performed, IV checked, risks and benefits discussed and monitors and equipment checked  Epidural Patient position: sitting Prep: site prepped and draped and DuraPrep Patient monitoring: continuous pulse ox and blood pressure Approach: midline Location: L3-L4 Injection technique: LOR air  Needle:  Needle type: Tuohy  Needle gauge: 17 G Needle length: 9 cm and 9 Needle insertion depth: 4 cm Catheter type: closed end flexible Catheter size: 19 Gauge Catheter at skin depth: 9 cm Test dose: negative and Other  Assessment Events: blood not aspirated, injection not painful, no injection resistance, negative IV test and no paresthesia  Additional Notes Patient identified. Risks and benefits discussed including failed block, incomplete  Pain control, post dural puncture headache, nerve damage, paralysis, blood pressure Changes, nausea, vomiting, reactions to medications-both toxic and allergic and post Partum back pain. All questions were answered. Patient expressed understanding and wished to proceed. Sterile technique was used throughout procedure. Epidural site was Dressed with sterile barrier dressing. No paresthesias, signs of intravascular injection Or signs of intrathecal spread were encountered.  Patient was more comfortable after the epidural was dosed. Please see RN's note for documentation of vital signs and FHR which are stable.

## 2014-11-19 NOTE — H&P (Signed)
Samantha BurkeSehrish [name] is a 29 y.o. female G2P1001 @ 39.5wks by 10wk scan presenting for eval of ctx. Also reports after arrival that she has been leaking since 6a on 7/6. Denies bldg. Her [facility] and has been remarkable for 1) prev C/S in [place]- desires TOLAC  History OB History    Gravida Para Term Preterm AB TAB SAB Ectopic Multiple Living   2 1 1       1      Past Medical History  Diagnosis Date  . Medical history non-contributory    Past Surgical History  Procedure Laterality Date  . Cesarean section     Family History: family history includes Asthma in her father. Social History:  reports that she has never smoked. She has never used smokeless tobacco. She reports that she does not drink alcohol or use illicit drugs.   Prenatal Transfer Tool  Maternal Diabetes: No Genetic Screening: Declined Maternal Ultrasounds/Referrals: Normal Fetal Ultrasounds or other Referrals:  None Maternal Substance Abuse:  No Significant Maternal Medications:  None Significant Maternal Lab Results:  Lab values include: Group B Strep negative Other Comments:  None  ROS  Dilation: 1 (FT to 1cm) Effacement (%): 50 Station: -3 Exam by:: Benji StanleyRN Blood pressure 113/76, pulse 91, temperature 98.7 F (37.1 C), temperature source Oral, resp. rate 16, height 5' 1.5" (1.562 m), weight 66.225 kg (146 lb), last menstrual period 02/24/2014, SpO2 99 %. Exam Physical Exam  Constitutional: She is oriented to person, place, and time. She appears well-developed.  HENT:  Head: Normocephalic.  Neck: Normal range of motion.  Cardiovascular: Normal rate.   Respiratory: Effort normal.  GI:  EFM 125, +accels, no decels Toco: irreg ctx w/ irritability  Musculoskeletal: Normal range of motion.  Neurological: She is alert and oriented to person, place, and time.  Skin: Skin is warm and dry.  Psychiatric: She has a normal mood and affect. Her behavior is normal. Thought  content normal.    Amnisure positive  Prenatal labs: ABO, Rh: O/POS/-- (03/17 1347) Antibody: NEG (03/17 1347) Rubella: 4.31 (03/17 1347) RPR: NON REAC (04/14 1507)  HBsAg: NEGATIVE (03/17 1347)  HIV: NONREACTIVE (04/14 1507)  GBS: Negative (06/16 0000)   Assessment/Plan: IUP@term  PROM x 23hrs Prev C/S- desires TOLAC  [facility] Plan to labor Will insert foley for ripening, and then follow with Pitocin   SHAW, KIMBERLY CNM 11/19/2014, 4:51 AM

## 2014-11-19 NOTE — MAU Note (Signed)
Pt reports since yesterday she has had abd pain vomiting, denies bleeding.

## 2014-11-19 NOTE — Progress Notes (Signed)
RN called Telephonic Interpreter line, was told there is not an interpreter for pt's language of [place] available at this time.  Stated they are unsure of when one will be available.

## 2014-11-19 NOTE — Progress Notes (Signed)
Patient ID: [name], female   DOB: 13-Jan-1986, 29 y.o.   MRN: [id number]   Remains comfortable; some ctx on monitor but not painful  EFM 120s, +accels, no decels Ctx irreg Cx 1/50/-2, membrane palp; foley inserted without difficulty and inflated w/ 60cc  IOL process described and questions answered  Cam HaiSHAW, Rafaella Kole 11/19/2014 7:22 AM

## 2014-11-19 NOTE — Plan of Care (Signed)
Problem: Consults Goal: [facility] Suit[facility] Patient Information Press F2 to bring up selections list  Outcome: Completed/Met Date Met:  11/19/14  Pt 37-[redacted] weeks EGA

## 2014-11-19 NOTE — Progress Notes (Signed)
Was going to recheck pt before d/c to home.  Noted clear watery discharge with mucus at vaginal area.  Pt reported white thick discharge when I asked her earlier with pacific interpretor.  Asked pt and husband again and she reported watery discharge with some white at 6 am yesterday morning and again in afternoon.  Notified Philipp DeputyKim Shaw CNM and amnisure collected.

## 2014-11-19 NOTE — Progress Notes (Signed)
Labor Progress Note  S: Comfortable   O:  BP 124/75 mmHg  Pulse 82  Temp(Src) 98.5 F (36.9 C) (Oral)  Resp 18  Ht 5' 1.5" (1.562 m)  Wt 146 lb (66.225 kg)  BMI 27.14 kg/m2  SpO2 100%  LMP 02/24/2014 Cat II: 130 bpm baseline, good variability, + 15 x 15 accels, occ. early decels  CVE: Dilation: 10 Dilation Complete Date: 11/19/14 Dilation Complete Time: 1405 Effacement (%): 100 Cervical Position: Middle Station: +1 Presentation: Vertex Exam by:: A.Davis, RN  Caput observed  A&P: 29 y.o. G2P1001 4152w5d SOL, TOLAC #labor: pt is pushing, but having some difficulty understanding concept of adequate pushing --> working with nursing and interpreters to attempt to convey concept to pt #pain: turning epidural down in an attempt to help pt feel contractions more and help her to push adequately    De Hollingsheadatherine L Wallace, DO 4:45 PM

## 2014-11-19 NOTE — Anesthesia Preprocedure Evaluation (Signed)
Anesthesia Evaluation  Patient identified by MRN, date of birth, ID band Patient awake    Reviewed: Allergy & Precautions, Patient's Chart, lab work & pertinent test results  Airway Mallampati: II  TM Distance: >3 FB Neck ROM: Full    Dental no notable dental hx. (+) Teeth Intact   Pulmonary neg pulmonary ROS,  breath sounds clear to auscultation  Pulmonary exam normal       Cardiovascular negative cardio ROS Normal cardiovascular examRhythm:Regular Rate:Normal - Carotid Bruit    Neuro/Psych negative neurological ROS  negative psych ROS   GI/Hepatic Neg liver ROS, GERD-  Medicated and Controlled,  Endo/Other  negative endocrine ROS  Renal/GU negative Renal ROS  negative genitourinary   Musculoskeletal negative musculoskeletal ROS (+)   Abdominal (+) - obese,   Peds  Hematology negative hematology ROS (+)   Anesthesia Other Findings   Reproductive/Obstetrics (+) Pregnancy Previous C/Section for failure to progress in [place] Term                             Anesthesia Physical Anesthesia Plan  ASA: II  Anesthesia Plan: Epidural   Post-op Pain Management:    Induction:   Airway Management Planned: Natural Airway  Additional Equipment:   Intra-op Plan:   Post-operative Plan:   Informed Consent: I have reviewed the patients History and Physical, chart, labs and discussed the procedure including the risks, benefits and alternatives for the proposed anesthesia with the patient or authorized representative who has indicated his/her understanding and acceptance.     Plan Discussed with: Anesthesiologist  Anesthesia Plan Comments:         Anesthesia Quick Evaluation

## 2014-11-19 NOTE — Progress Notes (Signed)
Labor Progress Note  S: Feeling the urge to push but comfortable with epidural   O:  BP 120/84 mmHg  Pulse 69  Temp(Src) 97.9 F (36.6 C) (Oral)  Resp 18  Ht 5' 1.5" (1.562 m)  Wt 146 lb (66.225 kg)  BMI 27.14 kg/m2  SpO2 100%  LMP 02/24/2014 Cat II: baseline 140bpm, good variability, +accels, occ. Early/variable decels  CVE: Dilation: 10 Dilation Complete Date: 11/19/14 Dilation Complete Time: 1405 Effacement (%): 100 Cervical Position: Middle Station: +2 Presentation: Vertex Exam by:: Dr. Earlene PlaterWallace Station +2 with ctx, 0 at rest   A&P: 29 y.o. G2P1001 3834w5d SOL, TOLAC #labor: progressing very well, pt on peanut, will begin pushing soon  #pain: epidural in place   [name], DO 2:14 PM

## 2014-11-19 NOTE — Discharge Instructions (Signed)
Braxton Hicks Contractions °Contractions of the uterus can occur throughout pregnancy. Contractions are not always a sign that you are in labor.  °WHAT ARE BRAXTON HICKS CONTRACTIONS?  °Contractions that occur before labor are called Braxton Hicks contractions, or false labor. Toward the end of pregnancy (32-34 weeks), these contractions can develop more often and may become more forceful. This is not true labor because these contractions do not result in opening (dilatation) and thinning of the cervix. They are sometimes difficult to tell apart from true labor because these contractions can be forceful and people have different pain tolerances. You should not feel embarrassed if you go to the hospital with false labor. Sometimes, the only way to tell if you are in true labor is for your health care provider to look for changes in the cervix. °If there are no prenatal problems or other health problems associated with the pregnancy, it is completely safe to be sent home with false labor and await the onset of true labor. °HOW CAN YOU TELL THE DIFFERENCE BETWEEN TRUE AND FALSE LABOR? °False Labor °· The contractions of false labor are usually shorter and not as hard as those of true labor.   °· The contractions are usually irregular.   °· The contractions are often felt in the front of the lower abdomen and in the groin.   °· The contractions may go away when you walk around or change positions while lying down.   °· The contractions get weaker and are shorter lasting as time goes on.   °· The contractions do not usually become progressively stronger, regular, and closer together as with true labor.   °True Labor °· Contractions in true labor last 30-70 seconds, become very regular, usually become more intense, and increase in frequency.   °· The contractions do not go away with walking.   °· The discomfort is usually felt in the top of the uterus and spreads to the lower abdomen and low back.   °· True labor can be  determined by your health care provider with an exam. This will show that the cervix is dilating and getting thinner.   °WHAT TO REMEMBER °· Keep up with your usual exercises and follow other instructions given by your health care provider.   °· Take medicines as directed by your health care provider.   °· Keep your regular prenatal appointments.   °· Eat and drink lightly if you think you are going into labor.   °· If Braxton Hicks contractions are making you uncomfortable:   °¨ Change your position from lying down or resting to walking, or from walking to resting.   °¨ Sit and rest in a tub of warm water.   °¨ Drink 2-3 glasses of water. Dehydration may cause these contractions.   °¨ Do slow and deep breathing several times an hour.   °WHEN SHOULD I SEEK IMMEDIATE MEDICAL CARE? °Seek immediate medical care if: °· Your contractions become stronger, more regular, and closer together.   °· You have fluid leaking or gushing from your vagina.   °· You have a fever.   °· You pass blood-tinged mucus.   °· You have vaginal bleeding.   °· You have continuous abdominal pain.   °· You have low back pain that you never had before.   °· You feel your baby's head pushing down and causing pelvic pressure.   °· Your baby is not moving as much as it used to.   °Document Released: 05/01/2005 Document Revised: 05/06/2013 Document Reviewed: 02/10/2013 °ExitCare® Patient Information ©2015 [employer]. This information is not intended to replace advice given to you by your health care   provider. Make sure you discuss any questions you have with your health care provider. °Fetal Movement Counts °Patient Name: __________________________________________________ Patient Due Date: ____________________ °Performing a fetal movement count is highly recommended in high-risk pregnancies, but it is good for every pregnant woman to do. Your health care provider may ask you to start counting fetal movements at 28 weeks of the pregnancy. Fetal  movements often increase: °· After eating a full meal. °· After physical activity. °· After eating or drinking something sweet or cold. °· At rest. °Pay attention to when you feel the baby is most active. This will help you notice a pattern of your baby's sleep and wake cycles and what factors contribute to an increase in fetal movement. It is important to perform a fetal movement count at the same time each day when your baby is normally most active.  °HOW TO COUNT FETAL MOVEMENTS °· Find a quiet and comfortable area to sit or lie down on your left side. Lying on your left side provides the best blood and oxygen circulation to your baby. °· Write down the day and time on a sheet of paper or in a journal. °· Start counting kicks, flutters, swishes, rolls, or jabs in a 2-hour period. You should feel at least 10 movements within 2 hours. °· If you do not feel 10 movements in 2 hours, wait 2-3 hours and count again. Look for a change in the pattern or not enough counts in 2 hours. °SEEK MEDICAL CARE IF: °· You feel less than 10 counts in 2 hours, tried twice. °· There is no movement in over an hour. °· The pattern is changing or taking longer each day to reach 10 counts in 2 hours. °· You feel the baby is not moving as he or she usually does. °Date: ____________ Movements: ____________ Start time: ____________ Finish time: ____________  °Date: ____________ Movements: ____________ Start time: ____________ Finish time: ____________ °Date: ____________ Movements: ____________ Start time: ____________ Finish time: ____________ °Date: ____________ Movements: ____________ Start time: ____________ Finish time: ____________ °Date: ____________ Movements: ____________ Start time: ____________ Finish time: ____________ °Date: ____________ Movements: ____________ Start time: ____________ Finish time: ____________ °Date: ____________ Movements: ____________ Start time: ____________ Finish time: ____________ °Date: ____________  Movements: ____________ Start time: ____________ Finish time: ____________  °Date: ____________ Movements: ____________ Start time: ____________ Finish time: ____________ °Date: ____________ Movements: ____________ Start time: ____________ Finish time: ____________ °Date: ____________ Movements: ____________ Start time: ____________ Finish time: ____________ °Date: ____________ Movements: ____________ Start time: ____________ Finish time: ____________ °Date: ____________ Movements: ____________ Start time: ____________ Finish time: ____________ °Date: ____________ Movements: ____________ Start time: ____________ Finish time: ____________ °Date: ____________ Movements: ____________ Start time: ____________ Finish time: ____________  °Date: ____________ Movements: ____________ Start time: ____________ Finish time: ____________ °Date: ____________ Movements: ____________ Start time: ____________ Finish time: ____________ °Date: ____________ Movements: ____________ Start time: ____________ Finish time: ____________ °Date: ____________ Movements: ____________ Start time: ____________ Finish time: ____________ °Date: ____________ Movements: ____________ Start time: ____________ Finish time: ____________ °Date: ____________ Movements: ____________ Start time: ____________ Finish time: ____________ °Date: ____________ Movements: ____________ Start time: ____________ Finish time: ____________  °Date: ____________ Movements: ____________ Start time: ____________ Finish time: ____________ °Date: ____________ Movements: ____________ Start time: ____________ Finish time: ____________ °Date: ____________ Movements: ____________ Start time: ____________ Finish time: ____________ °Date: ____________ Movements: ____________ Start time: ____________ Finish time: ____________ °Date: ____________ Movements: ____________ Start time: ____________ Finish time: ____________ °Date: ____________ Movements: ____________ Start time:  ____________ Finish time: ____________ °Date: ____________ Movements: ____________   Start time: ____________ Doreatha MartinFinish time: ____________  Date: ____________ Movements: ____________ Start time: ____________ Doreatha MartinFinish time: ____________ Date: ____________ Movements: ____________ Start time: ____________ Doreatha MartinFinish time: ____________ Date: ____________ Movements: ____________ Start time: ____________ Doreatha MartinFinish time: ____________ Date: ____________ Movements: ____________ Start time: ____________ Doreatha MartinFinish time: ____________ Date: ____________ Movements: ____________ Start time: ____________ Doreatha MartinFinish time: ____________ Date: ____________ Movements: ____________ Start time: ____________ Doreatha MartinFinish time: ____________ Date: ____________ Movements: ____________ Start time: ____________ Doreatha MartinFinish time: ____________  Date: ____________ Movements: ____________ Start time: ____________ Doreatha MartinFinish time: ____________ Date: ____________ Movements: ____________ Start time: ____________ Doreatha MartinFinish time: ____________ Date: ____________ Movements: ____________ Start time: ____________ Doreatha MartinFinish time: ____________ Date: ____________ Movements: ____________ Start time: ____________ Doreatha MartinFinish time: ____________ Date: ____________ Movements: ____________ Start time: ____________ Doreatha MartinFinish time: ____________ Date: ____________ Movements: ____________ Start time: ____________ Doreatha MartinFinish time: ____________ Date: ____________ Movements: ____________ Start time: ____________ Doreatha MartinFinish time: ____________  Date: ____________ Movements: ____________ Start time: ____________ Doreatha MartinFinish time: ____________ Date: ____________ Movements: ____________ Start time: ____________ Doreatha MartinFinish time: ____________ Date: ____________ Movements: ____________ Start time: ____________ Doreatha MartinFinish time: ____________ Date: ____________ Movements: ____________ Start time: ____________ Doreatha MartinFinish time: ____________ Date: ____________ Movements: ____________ Start time: ____________ Doreatha MartinFinish time: ____________ Date:  ____________ Movements: ____________ Start time: ____________ Doreatha MartinFinish time: ____________ Date: ____________ Movements: ____________ Start time: ____________ Doreatha MartinFinish time: ____________  Date: ____________ Movements: ____________ Start time: ____________ Doreatha MartinFinish time: ____________ Date: ____________ Movements: ____________ Start time: ____________ Doreatha MartinFinish time: ____________ Date: ____________ Movements: ____________ Start time: ____________ Doreatha MartinFinish time: ____________ Date: ____________ Movements: ____________ Start time: ____________ Doreatha MartinFinish time: ____________ Date: ____________ Movements: ____________ Start time: ____________ Doreatha MartinFinish time: ____________ Date: ____________ Movements: ____________ Start time: ____________ Doreatha MartinFinish time: ____________ Document Released: 05/31/2006 Document Revised: 09/15/2013 Document Reviewed: 02/26/2012 ExitCare Patient Information 2015 [employer]. This information is not intended to replace advice given to you by your health care provider. Make sure you discuss any questions you have with your health care provider. Third Trimester of Pregnancy The third trimester is from week 29 through week 42, months 7 through 9. This trimester is when your unborn baby (fetus) is growing very fast. At the end of the ninth month, the unborn baby is about 20 inches in length. It weighs about 6-10 pounds.  HOME CARE   Avoid all smoking, herbs, and alcohol. Avoid drugs not approved by your doctor.  Only take medicine as told by your doctor. Some medicines are safe and some are not during pregnancy.  Exercise only as told by your doctor. Stop exercising if you start having cramps.  Eat regular, healthy meals.  Wear a good support bra if your breasts are tender.  Do not use hot tubs, steam rooms, or saunas.  Wear your seat belt when driving.  Avoid raw meat, uncooked cheese, and liter boxes and soil used by cats.  Take your prenatal vitamins.  Try taking medicine that helps you  poop (stool softener) as needed, and if your doctor approves. Eat more fiber by eating fresh fruit, vegetables, and whole grains. Drink enough fluids to keep your pee (urine) clear or pale yellow.  Take warm water baths (sitz baths) to soothe pain or discomfort caused by hemorrhoids. Use hemorrhoid cream if your doctor approves.  If you have puffy, bulging veins (varicose veins), wear support hose. Raise (elevate) your feet for 15 minutes, 3-4 times a day. Limit salt in your diet.  Avoid heavy lifting, wear low heels, and sit up straight.  Rest with your legs raised if you have leg cramps or low back pain.  Visit your  dentist if you have not gone during your pregnancy. Use a soft toothbrush to brush your teeth. Be gentle when you floss.  You can have sex (intercourse) unless your doctor tells you not to.  Do not travel far distances unless you must. Only do so with your doctor's approval.  Take prenatal classes.  Practice driving to the hospital.  Pack your hospital bag.  Prepare the baby's room.  Go to your doctor visits. GET HELP IF:  You are not sure if you are in labor or if your water has broken.  You are dizzy.  You have mild cramps or pressure in your lower belly (abdominal).  You have a nagging pain in your belly area.  You continue to feel sick to your stomach (nauseous), throw up (vomit), or have watery poop (diarrhea).  You have bad smelling fluid coming from your vagina.  You have pain with peeing (urination). GET HELP RIGHT AWAY IF:   You have a fever.  You are leaking fluid from your vagina.  You are spotting or bleeding from your vagina.  You have severe belly cramping or pain.  You lose or gain weight rapidly.  You have trouble catching your breath and have chest pain.  You notice sudden or extreme puffiness (swelling) of your face, hands, ankles, feet, or legs.  You have not felt the baby move in over an hour.  You have severe headaches that  do not go away with medicine.  You have vision changes. Document Released: 07/26/2009 Document Revised: 08/26/2012 Document Reviewed: 07/02/2012 [facility] Patient Information 2015 [place]ExitCare, [place]LL[place]. This information is not intended to replace advice given to you by your health care provider. Make sure you discuss any questions you have with your health care provider.

## 2014-11-20 MED ORDER — OXYCODONE-ACETAMINOPHEN 5-325 MG PO TABS
1.0000 | ORAL_TABLET | ORAL | Status: DC | PRN
  Administered 2014-11-20: 1 via ORAL
  Administered 2014-11-21: 2 via ORAL
  Filled 2014-11-20: qty 1
  Filled 2014-11-20: qty 2

## 2014-11-20 NOTE — Lactation Note (Signed)
This note was copied from the chart of Samantha Angela BurkeSehrish Mans. Lactation Consultation Note  Patient Name: Samantha [name] WGNFA'OToday's Date: 11/20/2014 Reason for consult: Initial assessment FOB present to interpret. Mom reports BF is going well, reports some mild nipple tenderness, denies cracking or bleeding. Advised to apply EBM for comfort. Mom is experienced BF. Encouraged to BF with feeding ques, 8-12 times or more in 24 hours. Mom denies other questions/concerns. Lactation brochure left for review, advised of OP services and support group. Encouraged to call if Mom would like assist with latch due to nipple tenderness.   Maternal Data Has patient been taught Hand Expression?: No (Per FOB interpreting, Mom reports she knows how to H/E, declined assist) Does the patient have breastfeeding experience prior to this delivery?: Yes  Feeding Feeding Type: Breast Fed Length of feed: 20 min  LATCH Score/Interventions                      Lactation Tools Discussed/Used WIC Program: Yes   Consult Status Consult Status: Follow-up Date: 11/21/14 Follow-up type: In-patient    Samantha [name], Samantha [name] 11/20/2014, 4:16 PM

## 2014-11-20 NOTE — Anesthesia Postprocedure Evaluation (Signed)
  Anesthesia Post-op Note  Patient: Samantha [name]  Procedure(s) Performed: * No procedures listed *  Patient Location: PACU and Mother/Baby  Anesthesia Type:Epidural  Level of Consciousness: awake, alert  and oriented  Airway and Oxygen Therapy: Patient Spontanous Breathing  Post-op Pain: none  Post-op Assessment: Post-op Vital signs reviewed, Patient's Cardiovascular Status Stable, No headache, No backache, No residual numbness and No residual motor weakness  Post-op Vital Signs: Reviewed and stable  Complications: No apparent anesthesia complications

## 2014-11-20 NOTE — Progress Notes (Signed)
Post Partum Day 1 Subjective: no complaints, up ad lib, voiding and tolerating PO  Objective: Blood pressure 112/69, pulse 74, temperature 98.3 F (36.8 C), temperature source Oral, resp. rate 18, height 5' 1.5" (1.562 m), weight 66.225 kg (146 lb), last menstrual period 02/24/2014, SpO2 99 %, unknown if currently breastfeeding.  Physical Exam:  General: alert and cooperative Lochia: appropriate Uterine Fundus: firm Incision:  DVT Evaluation: No evidence of DVT seen on physical exam. Negative Homan's sign. No cords or calf tenderness.   Recent Labs  11/19/14 0520  HGB 13.0  HCT 37.9    Assessment/Plan: Plan for discharge tomorrow and Breastfeeding   LOS: 1 day   Quade Ramirez Grissett 11/20/2014, 10:18 AM

## 2014-11-21 MED ORDER — IBUPROFEN 600 MG PO TABS: 600.0000 mg | ORAL_TABLET | Freq: Four times a day (QID) | ORAL | Status: DC | PRN

## 2014-11-21 NOTE — Lactation Note (Signed)
This note was copied from the chart of Samantha Angela BurkeSehrish Vidrine. Lactation Consultation Note  Patient Name: Samantha Angela BurkeSehrish Arndt KZSWF'UToday's Date: 11/21/2014 Reason for consult: Follow-up assessment   With this mom of a term baby. The baby was asleep in the crib. Dad interpreted for me. On exam, mom has easily expressed transitional milk, with evert nipples, Mom knows to call for questions/concerns, but has none at this time.    Maternal Data    Feeding    LATCH Score/Interventions       Type of Nipple: Everted at rest and after stimulation  Comfort (Breast/Nipple): Soft / non-tender           Lactation Tools Discussed/Used     Consult Status Consult Status: Complete Follow-up type: Call as needed    Alfred LevinsLee, Bryleigh Ottaway Anne 11/21/2014, 9:00 AM

## 2014-11-21 NOTE — Discharge Summary (Signed)
Obstetric Discharge Summary Reason for Admission: rupture of membranes Prenatal Procedures: none Intrapartum Procedures: spontaneous vaginal delivery Postpartum Procedures: none Complications-Operative and Postpartum: 2nd degree perineal laceration HEMOGLOBIN  Date Value Ref Range Status  11/19/2014 13.0 12.0 - 15.0 g/dL Final   HCT  Date Value Ref Range Status  11/19/2014 37.9 36.0 - 46.0 % Final   Ms Welton FlakesKhan is a 28yo G2P1001 admitted in the early morning of 11/19/14 w/ leaking fluid x approx 23 hours. Cx was unfavorable and so foley bulb was inserted for ripening. Later that evening she progressed to VBAC. By PPD#2 she was ready for discharge home. Breastfeeding going well. [facility]  Physical Exam:  General: alert, cooperative and no distress  Heart: RRR Lungs: nl effort Lochia: appropriate Uterine Fundus: firm DVT Evaluation: No evidence of DVT seen on physical exam.  Discharge Diagnoses: Term Pregnancy-delivered  Discharge Information: Date: 11/21/2014 Activity: pelvic rest Diet: routine Medications: PNV and Ibuprofen Condition: stable Instructions: refer to practice specific booklet Discharge to: home Follow-up Information    [facility] Schedule an appointment as soon as possible for a visit in 4 weeks.   Why:  For your postpartum appointment.   Contact information:   [address] [place] [id number] [phone]      Newborn Data: Live born female  Birth Weight: 6 lb 10 oz (3005 g) APGAR: 9, 9  Home with mother.  Cam HaiSHAW, Cali Cuartas CNM 11/21/2014, 8:21 AM

## 2014-11-21 NOTE — Progress Notes (Signed)
Interim progress note   Asked by RN to come and evaluate patient's leg pain.  Husband interprets for patient.  She reports b/l musclular pain in calves and thighs since during delivery.  She denies any swelling.  PE: Gen: Sitting in bed, NAD MSK: TTP in b/l calves and thighs diffusely. No cords, no LE edema, negative homan's sign  A/P: Suspect muscle soreness after delivery.  Doubt DVT given b/l nature and diffuse pain without swelling - Reassess in AM  Erasmo DownerAngela M Jo-Ann Johanning, MD, MPH PGY-2,  [facility] 11/21/2014 12:38 AM

## 2014-12-10 ENCOUNTER — Encounter (HOSPITAL_COMMUNITY): Payer: Self-pay

## 2014-12-10 ENCOUNTER — Inpatient Hospital Stay (HOSPITAL_COMMUNITY)
Admission: AD | Admit: 2014-12-10 | Discharge: 2014-12-10 | Disposition: A | Discharge status: Home / Self Care | Payer: Medicaid Other | Source: Ambulatory Visit | Attending: Obstetrics and Gynecology | Admitting: Obstetrics and Gynecology

## 2014-12-10 DIAGNOSIS — O9089 Other complications of the puerperium, not elsewhere classified: Secondary | ICD-10-CM | POA: Insufficient documentation

## 2014-12-10 DIAGNOSIS — M545 Low back pain, unspecified: Secondary | ICD-10-CM

## 2014-12-10 DIAGNOSIS — R51 Headache: Secondary | ICD-10-CM | POA: Diagnosis present

## 2014-12-10 LAB — URINALYSIS, ROUTINE W REFLEX MICROSCOPIC
BILIRUBIN URINE: NEGATIVE
GLUCOSE, UA: NEGATIVE mg/dL
KETONES UR: NEGATIVE mg/dL
Nitrite: NEGATIVE
PH: 5.5 (ref 5.0–8.0)
Protein, ur: NEGATIVE mg/dL
Specific Gravity, Urine: 1.025 (ref 1.005–1.030)
UROBILINOGEN UA: 0.2 mg/dL (ref 0.0–1.0)

## 2014-12-10 LAB — URINE MICROSCOPIC-ADD ON

## 2014-12-10 NOTE — Discharge Instructions (Signed)

## 2014-12-10 NOTE — MAU Note (Signed)
Pt reports back pain and chills x 3 days. S/p vaginal delivery on 07/07. Breastfeeding without problems.

## 2014-12-10 NOTE — MAU Provider Note (Signed)
History     CSN: [id number]  Arrival date and time: 12/10/14 0103   First Provider Initiated Contact with Patient 12/10/14 0154      No chief complaint on file.  HPI Comments: Samantha [name] is a 29 y.o. G2P2001 who is s/p NSVD on 11/19/15. She presents today with headache, backache and chills x 3 days. She has tried ibuprofen and tylenol, but they have not helped. She has not taken anything today for her sx. She states that her bleeding is very light, and is mostly spotting. She states that breastfeeding is going well. She has occasional breast pain.     Past Medical History  Diagnosis Date  . Medical history non-contributory     Past Surgical History  Procedure Laterality Date  . Cesarean section      Family History  Problem Relation Age of Onset  . Asthma Father     History  Substance Use Topics  . Smoking status: Never Smoker   . Smokeless tobacco: Never Used  . Alcohol Use: No    Allergies: No Known Allergies  Prescriptions prior to admission  Medication Sig Dispense Refill Last Dose  . acetaminophen (TYLENOL) 325 MG tablet Take 650 mg by mouth every 6 (six) hours as needed.   12/09/2014 at Unknown time  . ibuprofen (ADVIL,MOTRIN) 600 MG tablet Take 1 tablet (600 mg total) by mouth every 6 (six) hours as needed. 30 tablet 0 12/09/2014 at Unknown time  . Prenatal Vit-Fe Fumarate-FA (PRENATAL MULTIVITAMIN) TABS tablet Take 1 tablet by mouth daily at 12 noon.   12/09/2014 at Unknown time  . hydrocortisone 1 % ointment Apply 1 application topically 2 (two) times daily. 30 g 0 Past Month at Unknown time    Review of Systems  Constitutional: Positive for chills. Negative for fever.  Gastrointestinal: Negative for nausea, vomiting and abdominal pain.  Genitourinary: Positive for dysuria (occasional. Not with every void ). Negative for urgency and frequency.   Physical Exam   Blood pressure 105/74, pulse 83, temperature 98.5 F (36.9 C), temperature source Oral, resp.  rate 16, SpO2 98 %, unknown if currently breastfeeding.  Physical Exam  Nursing note and vitals reviewed. Constitutional: She is oriented to person, place, and time. She appears well-developed and well-nourished. No distress.  HENT:  Head: Normocephalic.  Cardiovascular: Normal rate.   Respiratory: Effort normal. Right breast exhibits no inverted nipple, no mass, no nipple discharge, no skin change and no tenderness. Left breast exhibits no inverted nipple, no mass, no nipple discharge, no skin change and no tenderness. Breasts are symmetrical.  GI: Soft. There is no tenderness. There is no rebound.  Neurological: She is alert and oriented to person, place, and time.  Skin: Skin is warm and dry.  Psychiatric: She has a normal mood and affect.   Results for orders placed or performed during the hospital encounter of 12/10/14 (from the past 24 hour(s))  Urinalysis, Routine w reflex microscopic (not at [facility])     Status: Abnormal   Collection Time: 12/10/14  1:30 AM  Result Value Ref Range   Color, Urine YELLOW YELLOW   APPearance CLEAR CLEAR   Specific Gravity, Urine 1.025 1.005 - 1.030   pH 5.5 5.0 - 8.0   Glucose, UA NEGATIVE NEGATIVE mg/dL   Hgb urine dipstick LARGE (A) NEGATIVE   Bilirubin Urine NEGATIVE NEGATIVE   Ketones, ur NEGATIVE NEGATIVE mg/dL   Protein, ur NEGATIVE NEGATIVE mg/dL   Urobilinogen, UA 0.2 0.0 - 1.0 mg/dL  Nitrite NEGATIVE NEGATIVE   Leukocytes, UA SMALL (A) NEGATIVE  Urine microscopic-add on     Status: None   Collection Time: 12/10/14  1:30 AM  Result Value Ref Range   Squamous Epithelial / LPF RARE RARE   WBC, UA 3-6 <3 WBC/hpf   RBC / HPF 11-20 <3 RBC/hpf   Bacteria, UA RARE RARE     MAU Course  Procedures  MDM   Assessment and Plan   1. Bilateral low back pain without sciatica    DC home Comfort measures reviewed  RX: none UC pending  Return to MAU as needed FU with OB as planned  Follow-up Information    Follow up with [facility].   Specialty:  Obstetrics and Gynecology   Why:  As scheduled   Contact information:   [address] [place] [address] [phone]        Tawnya Crook 12/10/2014, 1:56 AM

## 2014-12-11 LAB — URINE CULTURE

## 2014-12-31 ENCOUNTER — Encounter: Payer: Self-pay | Admitting: Obstetrics & Gynecology

## 2014-12-31 ENCOUNTER — Ambulatory Visit (INDEPENDENT_AMBULATORY_CARE_PROVIDER_SITE_OTHER): Payer: Medicaid Other | Admitting: Obstetrics & Gynecology

## 2014-12-31 NOTE — Progress Notes (Signed)
Interpreter [employer]

## 2014-12-31 NOTE — Progress Notes (Signed)
  Subjective:     Samantha [name] is a 29 y.o. female who presents for a postpartum visit. She is 6 weeks postpartum following a spontaneous vaginal delivery. I have fully reviewed the prenatal and intrapartum course. The delivery was at 39 gestational weeks. Outcome: vaginal birth after cesarean (VBAC). Anesthesia: epidural. Postpartum course has been normal. Baby's course has been normal. Baby is feeding by breast. Bleeding staining only. Bowel function is normal. Bladder function is normal. Patient is not sexually active. Contraception method is condoms. Postpartum depression screening: negative.  The following portions of the patient's history were reviewed and updated as appropriate: allergies, current medications, past family history, past medical history, past social history, past surgical history and problem list.  Review of Systems A comprehensive review of systems was negative.   Objective:    BP 123/77 mmHg  Pulse 74  Temp(Src) 98.7 F (37.1 C) (Oral)  Ht  (1.575 m)  Wt 136 lb 4.8 oz (61.825 kg)  BMI 24.92 kg/m2  Breastfeeding? Yes  General:  alert   Breasts:  inspection negative, no nipple discharge or bleeding, no masses or nodularity palpable  Lungs: clear to auscultation bilaterally  Heart:  regular rate and rhythm, S1, S2 normal, no murmur, click, rub or gallop  Abdomen: soft, non-tender; bowel sounds normal; no masses,  no organomegaly   Vulva:  normal  Vagina: normal vagina  Cervix:  anteverted  Corpus: normal  Adnexa:  no mass, fullness, tenderness  Rectal Exam: Not performed.        Assessment:     Normal postpartum exam. Pap smear not done at today's visit.   Plan:   Contraception: condoms  RTC 1 year/prn sooner Rec continue MVIs daily

## 2015-04-19 ENCOUNTER — Emergency Department (HOSPITAL_COMMUNITY)
Admission: EM | Admit: 2015-04-19 | Discharge: 2015-04-19 | Disposition: A | Discharge status: Home / Self Care | Payer: Medicaid Other | Attending: Emergency Medicine | Admitting: Emergency Medicine

## 2015-04-19 ENCOUNTER — Encounter (HOSPITAL_COMMUNITY): Payer: Self-pay | Admitting: Emergency Medicine

## 2015-04-19 DIAGNOSIS — Z79899 Other long term (current) drug therapy: Secondary | ICD-10-CM | POA: Insufficient documentation

## 2015-04-19 DIAGNOSIS — M79646 Pain in unspecified finger(s): Secondary | ICD-10-CM

## 2015-04-19 DIAGNOSIS — M79641 Pain in right hand: Secondary | ICD-10-CM | POA: Insufficient documentation

## 2015-04-19 MED ORDER — GABAPENTIN 300 MG PO CAPS: 300.0000 mg | ORAL_CAPSULE | Freq: Three times a day (TID) | ORAL | Status: DC

## 2015-04-19 MED ORDER — TRAMADOL HCL 50 MG PO TABS: 50.0000 mg | ORAL_TABLET | Freq: Four times a day (QID) | ORAL | Status: DC | PRN

## 2015-04-19 NOTE — ED Notes (Signed)
The patient's husband says she has had pain on the ends of all her fingers for a year.  He is not sure what is causing it but he thinks it is a reaciton to something.  It hurts her to touch heat and if she washes dishes.

## 2015-04-19 NOTE — ED Provider Notes (Signed)
CSN: [id number]     Arrival date & time 04/19/15  2013 History  By signing my name below, I, Gonzella Lex, attest that this documentation has been prepared under the direction and in the presence of [employer], PA-C. Electronically Signed: Gonzella Lex, Scribe. 04/19/2015. 9:07 PM.      Chief Complaint  Patient presents with  . Hand Pain    The patient's husband says she has had pain on the ends of all her fingers for a year.  He is not sure what is causing it but he thinks it is a reaciton to something.  It hurts her to touch heat and if she washes dishes.      The history is provided by the patient and a relative. No language interpreter was used.    HPI Comments:  Brother use as translator Samantha [name] is a 29 y.o. female who presents to the Emergency Department complaining of 9/10 aching, burning right-handed finger pain which has persisted for the past year but has worsened over the past 2 weeks. Per pt's brother, she has not been able to hold or tolerate warm substances and has not been able to grip anything. The pain has also begun to affect her sleep quality. Pt denies h/o HTN, hypercholesterolemia, smoking and alcohol consumption. She reports that she does all the cooking for the house which involves fine motor skills and exposure to hot pans and breads daily. She notes that she has been on no data this over the last couple weeks due to the pain. She reports the cold liquids do not cause significant sensitivity. She notes drying of the skin to the fingertips. She denies any loss of sensation to the fingers.  Past Medical History  Diagnosis Date  . Medical history non-contributory    Past Surgical History  Procedure Laterality Date  . Cesarean section     Family History  Problem Relation Age of Onset  . Asthma Father    Social History  Substance Use Topics  . Smoking status: Never Smoker   . Smokeless tobacco: Never Used  . Alcohol Use: No   OB History     Gravida Para Term Preterm AB TAB SAB Ectopic Multiple Living   0 1     Review of Systems  All other systems reviewed and are negative.  Allergies  Review of patient's allergies indicates no known allergies.  Home Medications   Prior to Admission medications   Medication Sig Start Date End Date Taking? Authorizing Provider  acetaminophen (TYLENOL) 325 MG tablet Take 650 mg by mouth every 6 (six) hours as needed.    Historical Provider, MD  gabapentin (NEURONTIN) 300 MG capsule Take 1 capsule (300 mg total) by mouth 3 (three) times daily. 04/19/15   Eyvonne Mechanic, PA-C  hydrocortisone 1 % ointment Apply 1 application topically 2 (two) times daily. Patient not taking: Reported on 12/31/2014 10/29/14   Marlis Edelson, CNM  ibuprofen (ADVIL,MOTRIN) 600 MG tablet Take 1 tablet (600 mg total) by mouth every 6 (six) hours as needed. Patient not taking: Reported on 12/31/2014 11/21/14   Arabella Merles, CNM  Prenatal Vit-Fe Fumarate-FA (PRENATAL MULTIVITAMIN) TABS tablet Take 1 tablet by mouth daily at 12 noon.    Historical Provider, MD  traMADol (ULTRAM) 50 MG tablet Take 1 tablet (50 mg total) by mouth every 6 (six) hours as needed. 04/19/15   Lakisha Peyser, PA-C   BP 120/75 mmHg  Pulse 84  Temp(Src) 98 F (36.7 C) (Oral)  Resp 16  SpO2 95%   Physical Exam  Constitutional: She is oriented to person, place, and time. She appears well-developed and well-nourished. No distress.  HENT:  Head: Normocephalic.  Eyes: Conjunctivae are normal.  Cardiovascular: Regular rhythm.   Pulmonary/Chest: Effort normal.  Abdominal: She exhibits no distension.  Musculoskeletal:  Patient has dryness and cracking to the distal fingertips. She has changes to the thumb nails with spooning. Cap refill is less than 3 seconds, sensation intact, full active range of motion. Patient has extreme tenderness to palpation to even light sensations at the distal fingertips. No pain with palpation of the  proximal hand, wrist.   Neurological: She is alert and oriented to person, place, and time.  Skin: Skin is warm and dry.  Psychiatric: She has a normal mood and affect.  Nursing note and vitals reviewed.   ED Course  Procedures  DIAGNOSTIC STUDIES:    Oxygen Saturation is 96% on RA, adequate by my interpretation.   COORDINATION OF CARE:  9:01 PM Advise pt to follow-up with a specialist. Will prescribe pt gabapentin. Discussed treatment plan with pt at bedside and pt agreed to plan.    MDM   Final diagnoses:  Pain of finger, unspecified laterality    Labs:  Imaging:  Consults:  Therapeutics:  Discharge Meds: gabapentin, ultram   Assessment/plan: Uncertain etiology of patient's presentation today. She does have some very minor skin changes to the distal fingers, with extreme pain to even light palpation. Question vasculitic pathology. Changes are slow as these symptoms have been present for approximately a year. Patient will be given gabapentin in attempt to find pain relief, along with tramadol as needed for breakthrough pain. Patient has no infectious signs or symptoms, she has perfusion to the distal extremities. Patient most certainly will need primary care follow-up for further evaluation and management and potential referral. [facility], she's also encouraged contact her health care insurance company to find in network providers that we'll be able to see her. She is given strict return precautions in the event new or worsening signs or symptoms present. Patient verbalized understanding and agreement for today's plan and had no further questions or concerns at time of discharge.  I personally performed the services described in this documentation, which was scribed in my presence. The recorded information has been reviewed and is accurate.      Eyvonne MechanicJeffrey Amil Bouwman, PA-C 04/20/15 [id number]  Pricilla LovelessScott Goldston, MD 04/24/15 [phone]

## 2015-04-19 NOTE — Discharge Instructions (Signed)

## 2015-11-19 ENCOUNTER — Encounter (HOSPITAL_COMMUNITY): Payer: Self-pay | Admitting: Emergency Medicine

## 2015-11-19 DIAGNOSIS — Z79899 Other long term (current) drug therapy: Secondary | ICD-10-CM | POA: Insufficient documentation

## 2015-11-19 DIAGNOSIS — N9489 Other specified conditions associated with female genital organs and menstrual cycle: Secondary | ICD-10-CM | POA: Insufficient documentation

## 2015-11-19 LAB — COMPREHENSIVE METABOLIC PANEL
ALK PHOS: 56 U/L (ref 38–126)
ALT: 11 U/L — AB (ref 14–54)
AST: 15 U/L (ref 15–41)
Albumin: 4 g/dL (ref 3.5–5.0)
Anion gap: 5 (ref 5–15)
BUN: 15 mg/dL (ref 6–20)
CALCIUM: 9.4 mg/dL (ref 8.9–10.3)
CO2: 24 mmol/L (ref 22–32)
CREATININE: 0.6 mg/dL (ref 0.44–1.00)
Chloride: 107 mmol/L (ref 101–111)
GFR calc Af Amer: >60 mL/min (ref >60)
GFR calc non Af Amer: >60 mL/min (ref >60)
Glucose, Bld: 116 mg/dL — ABNORMAL HIGH (ref 65–99)
Potassium: 3.7 mmol/L (ref 3.5–5.1)
Sodium: 136 mmol/L (ref 135–145)
Total Bilirubin: 0.3 mg/dL (ref 0.3–1.2)
Total Protein: 7.2 g/dL (ref 6.5–8.1)

## 2015-11-19 LAB — URINE MICROSCOPIC-ADD ON
RBC / HPF: NONE SEEN RBC/hpf (ref 0–5)
WBC, UA: NONE SEEN WBC/hpf (ref 0–5)

## 2015-11-19 LAB — URINALYSIS, ROUTINE W REFLEX MICROSCOPIC
BILIRUBIN URINE: NEGATIVE
GLUCOSE, UA: NEGATIVE mg/dL
KETONES UR: NEGATIVE mg/dL
Leukocytes, UA: NEGATIVE
Nitrite: NEGATIVE
Protein, ur: NEGATIVE mg/dL
Specific Gravity, Urine: 1.036 — ABNORMAL HIGH (ref 1.005–1.030)
pH: 5.5 (ref 5.0–8.0)

## 2015-11-19 LAB — CBC
HCT: 39.5 % (ref 36.0–46.0)
Hemoglobin: 12.8 g/dL (ref 12.0–15.0)
MCH: 29.3 pg (ref 26.0–34.0)
MCHC: 32.4 g/dL (ref 30.0–36.0)
MCV: 90.4 fL (ref 78.0–100.0)
PLATELETS: 246 10*3/uL (ref 150–400)
RBC: 4.37 MIL/uL (ref 3.87–5.11)
RDW: 12.2 % (ref 11.5–15.5)
WBC: 6.6 10*3/uL (ref 4.0–10.5)

## 2015-11-19 LAB — LIPASE, BLOOD: LIPASE: 22 U/L (ref 11–51)

## 2015-11-19 NOTE — ED Notes (Signed)
Per pt husband, c/o belly pain x4 months, states two days out of the week she feels fine but 5 days she has belly pain. Denies N/V/D. Pain is mild at this time. Pain never goes away completely. Pt is ambulatory, steady gait, NAD.

## 2015-11-19 NOTE — ED Notes (Signed)
Pt states pain also in bilateral legs.

## 2015-11-19 NOTE — ED Notes (Signed)
Pt also states sex is painful

## 2015-11-20 ENCOUNTER — Emergency Department (HOSPITAL_COMMUNITY)
Admission: EM | Admit: 2015-11-20 | Discharge: 2015-11-20 | Disposition: A | Discharge status: Home / Self Care | Payer: Self-pay | Attending: Emergency Medicine | Admitting: Emergency Medicine

## 2015-11-20 ENCOUNTER — Emergency Department (HOSPITAL_COMMUNITY): Payer: Self-pay

## 2015-11-20 DIAGNOSIS — N9489 Other specified conditions associated with female genital organs and menstrual cycle: Secondary | ICD-10-CM

## 2015-11-20 LAB — PREGNANCY, URINE: PREG TEST UR: NEGATIVE

## 2015-11-20 LAB — WET PREP, GENITAL
Sperm: NONE SEEN
Trich, Wet Prep: NONE SEEN
Yeast Wet Prep HPF POC: NONE SEEN

## 2015-11-20 MED ORDER — MORPHINE SULFATE (PF) 4 MG/ML IV SOLN
4.0000 mg | Freq: Once | INTRAVENOUS | Status: AC
  Administered 2015-11-20: 4 mg via INTRAVENOUS
  Filled 2015-11-20: qty 1

## 2015-11-20 MED ORDER — ONDANSETRON HCL 4 MG/2ML IJ SOLN
4.0000 mg | Freq: Once | INTRAMUSCULAR | Status: AC
  Administered 2015-11-20: 4 mg via INTRAVENOUS
  Filled 2015-11-20: qty 2

## 2015-11-20 MED ORDER — ONDANSETRON 4 MG PO TBDP: 4.0000 mg | ORAL_TABLET | Freq: Three times a day (TID) | ORAL | Status: DC | PRN

## 2015-11-20 MED ORDER — OXYCODONE-ACETAMINOPHEN 5-325 MG PO TABS
1.0000 | ORAL_TABLET | Freq: Four times a day (QID) | ORAL | Status: DC | PRN
Start: 2015-11-20 — End: 2015-11-26

## 2015-11-20 MED ORDER — IOPAMIDOL (ISOVUE-300) INJECTION 61%
INTRAVENOUS | Status: AC
  Administered 2015-11-20: 100 mL
  Filled 2015-11-20: qty 100

## 2015-11-20 NOTE — ED Provider Notes (Signed)
CSN: [id number]     Arrival date & time 11/19/15  2048 History  By signing my name below, I, Phillis Haggis, attest that this documentation has been prepared under the direction and in the presence of Shon Baton, MD. Electronically Signed: Phillis Haggis, ED Scribe. 11/20/2015. 1:13 AM.   Chief Complaint  Patient presents with  . Abdominal Pain  . Leg Pain   The history is provided by the patient. A language interpreter was used.  HPI Comments: Samantha [name] is a 30 y.o. female who presents to the Emergency Department complaining of recurrent abdominal pain onset 4 months ago. She states that she will have pain for 5-6 days a week then the pain will go away, but states that it is almost constant. She reports worsening pain with eating. Per triage note, pt reports associated bilateral leg pain and dyspareunia. Pt has not seen a doctor for these symptoms. Pt has used pain medication for her pain to no relief. Pt denies chance of pregnancy. LMP one week ago. She denies fever, nausea, vomiting, diarrhea, dysuria, or hematuria.  Painful intercourse.  Pt speaks Urdu.   Past Medical History  Diagnosis Date  . Medical history non-contributory    Past Surgical History  Procedure Laterality Date  . Cesarean section     Family History  Problem Relation Age of Onset  . Asthma Father    Social History  Substance Use Topics  . Smoking status: Never Smoker   . Smokeless tobacco: Never Used  . Alcohol Use: No   OB History    Gravida Para Term Preterm AB TAB SAB Ectopic Multiple Living   0 1     Review of Systems  Gastrointestinal: Positive for abdominal pain. Negative for nausea, vomiting and diarrhea.  Genitourinary: Positive for dyspareunia.  Musculoskeletal: Positive for arthralgias.  All other systems reviewed and are negative.  Allergies  Review of patient's allergies indicates no known allergies.  Home Medications   Prior to Admission medications   Medication  Sig Start Date End Date Taking? Authorizing Provider  gabapentin (NEURONTIN) 300 MG capsule Take 1 capsule (300 mg total) by mouth 3 (three) times daily. Patient not taking: Reported on 11/20/2015 04/19/15   Eyvonne Mechanic, PA-C  ondansetron (ZOFRAN ODT) 4 MG disintegrating tablet Take 1 tablet (4 mg total) by mouth every 8 (eight) hours as needed for nausea or vomiting. 11/20/15   Shon Baton, MD  oxyCODONE-acetaminophen (PERCOCET/ROXICET) 5-325 MG tablet Take 1-2 tablets by mouth every 6 (six) hours as needed for severe pain. 11/20/15   Shon Baton, MD  traMADol (ULTRAM) 50 MG tablet Take 1 tablet (50 mg total) by mouth every 6 (six) hours as needed. Patient not taking: Reported on 11/20/2015 04/19/15   Eyvonne Mechanic, PA-C   BP 105/61 mmHg  Pulse 64  Temp(Src) 98.6 F (37 C) (Oral)  Resp 16  SpO2 100%  LMP 11/12/2015 (Approximate) Physical Exam  Constitutional: She is oriented to person, place, and time. She appears well-developed and well-nourished. No distress.  HENT:  Head: Normocephalic and atraumatic.  Cardiovascular: Normal rate, regular rhythm and normal heart sounds.   No murmur heard. Pulmonary/Chest: Effort normal and breath sounds normal. No respiratory distress. She has no wheezes.  Abdominal: Soft. Bowel sounds are normal. There is no tenderness. There is no rebound.  Genitourinary:  Normal external vaginal exam, pain with insertion of speculum, diffuse ovarian and uterine tenderness, no masses noted, scant vaginal  discharge  Neurological: She is alert and oriented to person, place, and time.  Skin: Skin is warm and dry.  Psychiatric: She has a normal mood and affect.  Nursing note and vitals reviewed.   ED Course  Procedures (including critical care time) DIAGNOSTIC STUDIES: Oxygen Saturation is 100% on RA, normal by my interpretation.    COORDINATION OF CARE: 1:11 AM-Discussed treatment plan which includes labs with pt at bedside and pt agreed to plan.     Labs Review Labs Reviewed  WET PREP, GENITAL - Abnormal; Notable for the following:    Clue Cells Wet Prep HPF POC PRESENT (*)    WBC, Wet Prep HPF POC FEW (*)    All other components within normal limits  COMPREHENSIVE METABOLIC PANEL - Abnormal; Notable for the following:    Glucose, Bld 116 (*)    ALT 11 (*)    All other components within normal limits  URINALYSIS, ROUTINE W REFLEX MICROSCOPIC (NOT AT [facility]) - Abnormal; Notable for the following:    APPearance CLOUDY (*)    Specific Gravity, Urine 1.036 (*)    Hgb urine dipstick TRACE (*)    All other components within normal limits  URINE MICROSCOPIC-ADD ON - Abnormal; Notable for the following:    Squamous Epithelial / LPF 0-5 (*)    Bacteria, UA RARE (*)    Crystals CA OXALATE CRYSTALS (*)    All other components within normal limits  LIPASE, BLOOD  CBC  PREGNANCY, URINE  POC URINE PREG, ED  GC/CHLAMYDIA PROBE AMP ([place]) NOT AT [facility]    Imaging Review Ct Abdomen Pelvis W Contrast  11/20/2015  CLINICAL DATA:  Abdominal pain for 4 months. EXAM: CT ABDOMEN AND PELVIS WITH CONTRAST TECHNIQUE: Multidetector CT imaging of the abdomen and pelvis was performed using the standard protocol following bolus administration of intravenous contrast. CONTRAST:  100mL ISOVUE-300 IOPAMIDOL (ISOVUE-300) INJECTION 61% COMPARISON:  None. FINDINGS: Lower chest:  The included lung bases are clear. Liver: Focal fatty infiltration about falciform ligament. Liver is otherwise unremarkable. Hepatobiliary: Gallbladder minimally distended. No calcified stone. No biliary dilatation. Pancreas: No ductal dilatation or inflammation. Spleen: Normal. Adrenal glands: No nodule. Kidneys: Symmetric renal enhancement. No hydronephrosis. No perinephric edema. Stomach/Bowel: Stomach is decompressed. There are no dilated or thickened small bowel loops. Small volume of stool throughout the colon without colonic wall thickening. The appendix is normal.  Vascular/Lymphatic: No retroperitoneal adenopathy. Prominent gonadal veins. Abdominal aorta is normal in caliber. Reproductive: Prominent periuterine and adnexal vascularity. Uterus is retroverted. Corpus luteal cyst in the left ovary. Right ovary is normal in size. Bladder: Physiologically distended.  No wall thickening. Other: Small amount of free fluid in the pelvis tracks in the right lower quadrant. There is no free air. No intra-abdominal abscess. Musculoskeletal: There are no acute or suspicious osseous abnormalities. Hemi transitional lumbosacral anatomy. IMPRESSION: 1. Prominent adnexal and periuterine vascularity and dilatation of the gonadal veins, can be seen with pelvic congestion syndrome. 2. Small volume free fluid in the pelvis and right lower quadrant, likely physiologic. 3. Otherwise no acute abnormality. Electronically Signed   By: Rubye OaksMelanie  Ehinger M.D.   On: 11/20/2015 02:59   I have personally reviewed and evaluated these images and lab results as part of my medical decision-making.   EKG Interpretation None      MDM   Final diagnoses:  Pelvic congestion syndrome    Patient presents with acute worsening of chronic abdominal pain. Nontoxic on exam. Abdominal exam is largely benign;  however, her pelvic exam has diffuse tenderness. Lab work largely unremarkable. CT scan shows evidence of possible pelvic congestion syndrome. This was shared with the patient and her family. Will discharge with pain and nausea medication. Follow-up with her OB/GYN for further evaluation and management.  After history, exam, and medical workup I feel the patient has been appropriately medically screened and is safe for discharge home. Pertinent diagnoses were discussed with the patient. Patient was given return precautions.  I personally performed the services described in this documentation, which was scribed in my presence. The recorded information has been reviewed and is accurate.    Shon Baton, MD 11/20/15 [phone]

## 2015-11-20 NOTE — Discharge Instructions (Signed)
Pelvic Pain (Pelvic Congestion Syndrome)    Ovarian Vein and Pelvic Varices  What is it?  It is estimated that a third of all women will experience chronic pelvic pain during their lifetime. Chronic pelvic pain is defined as "non-cyclic" pain lasting greater than six months. A multidisciplinary team approach is needed to treat this often complex medical condition. After a physical examination, a Pap test to rule out cervical cancer, and routine laboratory bloodwork, a cross-sectional imaging study is obtained to be certain that there is not a pelvic tumor. If the clinical symptoms are those of chronic pelvic pain, worse when sitting or standing, and sometimes also associated with varicose veins in the thigh, buttock regions, or vaginal area, the possibility of ovarian vein and pelvic varices must be considered.  Treatment  Because these varicose veins are often not visible externally and because imaging studies such as ultrasound may not demonstrate them, a woman's symptoms may be overlooked or ignored. Chronic pelvic pain due to ovarian vein and pelvic varices (varicose veins) is treated using nonsurgical, minimally invasive, transcatheter techniques. The diagnosis of ovarian varices is confirmed by selectively catheterizing specific veins and injecting contrast dye (i.e., performing a venogram). If varices are found on venography, they are embolized with small coils or other agents. The procedure is carried out under local anesthesia with intravenous sedation. Risks are the same for those of any minimally invasive procedure. The blood supply to the varices is blocked by the use of such coils and embolic agents. A 80% success rate in pain reduction has been reported, (i.e., a reduction in the quantity of pain medications that the woman requires). Pain reduction varies from complete to partial. It is well recognized that varicose veins in the legs cause pain, and therefore it is reasonable that such veins may be a  source of chronic pelvic pain if they exist in the pelvis.  The recovery time for women who undergo embolization of ovarian and pelvic varices is similar. Both procedures are generally performed with an overnight admission to the hospital, primarily for pain management during the first 24 hours. After that, the patient is discharged and spends time at home recovering, using oral pain medications for relief of discomfort. Pain is most significant during the first three days after either procedure.  Regarding ovarian and pelvic varices, the procedure is generally performed in two stages. The procedure is divided into two segments due to (1) contrast limitations (keeping within the safe limits of dye used for the venogram) and (2) the level of discomfort (pain) experienced by the patient. The second half of the embolization procedure is completed days two weeks later and is generally an out-patient procedure. This gives the woman time to recover in between the two-stage embolization procedure.

## 2015-11-20 NOTE — ED Notes (Signed)
Provider bedside w/ RN using interpretor phone

## 2015-11-22 LAB — GC/CHLAMYDIA PROBE AMP (~~LOC~~) NOT AT ARMC
Chlamydia: NEGATIVE
Neisseria Gonorrhea: NEGATIVE

## 2015-11-26 ENCOUNTER — Ambulatory Visit (INDEPENDENT_AMBULATORY_CARE_PROVIDER_SITE_OTHER): Payer: Self-pay | Admitting: Obstetrics and Gynecology

## 2015-11-26 ENCOUNTER — Encounter: Payer: Self-pay | Admitting: Obstetrics and Gynecology

## 2015-11-26 VITALS — BP 118/66 | Ht 63.0 in | Wt 141.5 lb

## 2015-11-26 DIAGNOSIS — N941 Unspecified dyspareunia: Secondary | ICD-10-CM

## 2015-11-26 DIAGNOSIS — R1030 Lower abdominal pain, unspecified: Secondary | ICD-10-CM

## 2015-11-26 DIAGNOSIS — R102 Pelvic and perineal pain: Secondary | ICD-10-CM

## 2015-11-26 NOTE — Progress Notes (Signed)
Patient ID: Samantha BurkeSehrish [name], female   DOB: 21-Apr-1986, 30 y.o.   MRN: [id number]    [facility]  @DATE @            Patient name: Samantha [name] MRN [id number]  Date of birth: 21-Apr-1986  CC & HPI:  Samantha [name] is a 30 y.o. female presenting today for ED follow-up after abdominal pain for the past 4 months. Pt also reports infrequent bowel movements of 2x per week and diarrhea. Pt states abdominal pain is not improved after bowel movements. She eats beef in her diet. Pt also reports occasional pain during sexual intercourse since delivering her second child and painful menstruation; these pains are similar. Pt is using condoms for birth control. CT A/P in the ED on 11/20/2015 revealed prominent adnexal and periuterine vascularity and dilatation of the gonadal veins, can be seen with pelvic congestion syndrome and small volume free fluid in the pelvis and right lower quadrant, likely physiologic.  ROS:  Review of Systems  Gastrointestinal: Positive for abdominal pain and diarrhea.  Genitourinary: Positive for dysuria.  All other systems reviewed and are negative.   Pertinent History Reviewed:   Reviewed: Significant for cesarean section Medical         Past Medical History  Diagnosis Date  . Medical history non-contributory                               Surgical Hx:    Past Surgical History  Procedure Laterality Date  . Cesarean section     Medications: Reviewed & Updated - see associated section                      No current outpatient prescriptions on file.   Social History: Reviewed -  reports that she has never smoked. She has never used smokeless tobacco.  Objective Findings:  Vitals: Blood pressure 118/66, height 5\' 3"  (1.6 m), weight 141 lb 8 oz (64.184 kg), last menstrual period 11/12/2015, currently breastfeeding.  Physical Examination: General appearance - alert, well appearing, and in no distress, oriented to person, place, and time and normal appearing  weight   Assessment & Plan:   A:  1. Gynecologic pains  P:  1. [place] 2. Given an appointment next month with Meridee ScoreWalidah Karimn , certified nurse midwife  By signing my name below, I, Marisue HumbleMichelle Chaffee, attest that this documentation has been prepared under the direction and in the presence of Tilda BurrowJohn V Cadin Luka, MD . Electronically Signed: Marisue HumbleMichelle Chaffee, Scribe. 11/26/2015. 10:31 AM.  I personally performed the services described in this documentation, which was SCRIBED in my presence. The recorded information has been reviewed and considered accurate. It has been edited as necessary during review. Tilda BurrowFERGUSON,Kyliyah Stirn V, MD

## 2015-11-26 NOTE — Progress Notes (Signed)
Patient ID: Samantha BurkeSehrish [name], female   DOB: 04-21-1986, 30 y.o.   MRN: [id number] Pt here today for follow up from ER last week. Pt's husband gave the following information due to language barrier. He states that his wife has been having a pain in her stomach for the last 4 months that starts after she eats. He states that his wife has pain during sex and has been for the last 4 months as well. He states that she noticed the pain after having her second child, her first child was c-section and second child was SVD. He states that she has more pain now with her periods but the bleeding is the same as usual. He states that she has noticed pain in both sides that radiates to the front into her stomach for a week now along with lower back pain, she denies any burning or pain with urination.

## 2015-11-30 ENCOUNTER — Inpatient Hospital Stay (HOSPITAL_COMMUNITY)
Admission: AD | Admit: 2015-11-30 | Discharge: 2015-11-30 | Discharge status: Against Medical Advice | Payer: Medicaid Other | Source: Ambulatory Visit | Attending: Family Medicine | Admitting: Family Medicine

## 2015-11-30 DIAGNOSIS — Z049 Encounter for examination and observation for unspecified reason: Secondary | ICD-10-CM | POA: Insufficient documentation

## 2015-11-30 LAB — URINE MICROSCOPIC-ADD ON

## 2015-11-30 LAB — POCT PREGNANCY, URINE: PREG TEST UR: NEGATIVE

## 2015-11-30 LAB — URINALYSIS, ROUTINE W REFLEX MICROSCOPIC
Bilirubin Urine: NEGATIVE
Glucose, UA: NEGATIVE mg/dL
Ketones, ur: NEGATIVE mg/dL
LEUKOCYTES UA: NEGATIVE
NITRITE: NEGATIVE
PROTEIN: NEGATIVE mg/dL
pH: 5.5 (ref 5.0–8.0)

## 2015-11-30 NOTE — MAU Note (Signed)
Not in lobby

## 2015-11-30 NOTE — MAU Note (Signed)
Pt C/O lower abd pain for 4 months, was seen @ [facility] approx 10 days ago, received a prescription for percocet, felt better with the medication.  Is out of the medication, is also now having back pain.  Denies vaginal bleeding.

## 2015-11-30 NOTE — MAU Note (Signed)
Not in lobby when called 

## 2016-01-03 ENCOUNTER — Ambulatory Visit (INDEPENDENT_AMBULATORY_CARE_PROVIDER_SITE_OTHER): Payer: No Typology Code available for payment source | Admitting: Family Medicine

## 2016-01-03 ENCOUNTER — Encounter: Payer: Self-pay | Admitting: Family Medicine

## 2016-01-03 VITALS — BP 108/85 | HR 100 | Temp 98.7°F | Ht 62.0 in | Wt 142.0 lb

## 2016-01-03 DIAGNOSIS — Z7689 Persons encountering health services in other specified circumstances: Secondary | ICD-10-CM

## 2016-01-03 DIAGNOSIS — Z0000 Encounter for general adult medical examination without abnormal findings: Secondary | ICD-10-CM

## 2016-01-03 DIAGNOSIS — Z7189 Other specified counseling: Secondary | ICD-10-CM

## 2016-01-03 LAB — POC URINE PREG, ED: PREG TEST UR: NEGATIVE

## 2016-01-03 LAB — POCT GLYCOSYLATED HEMOGLOBIN (HGB A1C): Hemoglobin A1C: 5.1

## 2016-01-03 NOTE — Patient Instructions (Signed)
Follow-up with GYN as planned Return visit in one year and as needed.

## 2016-01-03 NOTE — Progress Notes (Signed)
Samantha [name], is a 30 y.o. female  [id number]  [id number]  DOB - [birth year]  CC:  Chief Complaint  Patient presents with  . New Patient (Initial Visit)    here to get established, will take flu vaccine, having irregular menses with heavy bleeding at times, still breast feeding       HPI: Samantha [name] is a 30 y.o. female here to establish care.  Information is obtained with the help of an interpreter. Her only complaint today is of irregular and heavy periods. She has an appointment with GYN coming up.She is on no regular medications and denies chronic conditions.  Health Maintenance: She is in need of screening for diabetes and in need of flu shot. These will be done today. She denies tobacco, alcohol, and drug use.   No Known Allergies Past Medical History:  Diagnosis Date  . Medical history non-contributory    No current outpatient prescriptions on file prior to visit.   No current facility-administered medications on file prior to visit.    Family History  Problem Relation Age of Onset  . Asthma Father    Social History   Social History  . Marital status: Married    Spouse name: N/A  . Number of children: N/A  . Years of education: N/A   Occupational History  . Not on file.   Social History Main Topics  . Smoking status: Never Smoker  . Smokeless tobacco: Never Used  . Alcohol use No  . Drug use: No  . Sexual activity: Yes    Birth control/ protection: None   Other Topics Concern  . Not on file   Social History Narrative  . No narrative on file    Review of Systems: Constitutional: Negative for fever, chills, appetite change, weight loss,  Fatigue. Skin: Negative for rashes or lesions of concern. HENT: Negative for ear pain, ear discharge.nose bleeds Eyes: Negative for pain, discharge, redness, itching and visual disturbance. Neck: Negative for pain, stiffness Respiratory: Negative for cough, shortness of breath,   Cardiovascular: Negative  for chest pain, palpitations and leg swelling. Gastrointestinal: Negative for abdominal pain, nausea, vomiting, diarrhea, constipations Genitourinary: Negative for dysuria, urgency, frequency, hematuria,  Musculoskeletal: Negative for joint pain, joint  swelling, and gait problem.Negative for weakness.Positive for low back pain Neurological: Negative for dizziness, tremors, seizures, syncope,   light-headedness, numbness and headaches.  Hematological: Negative for easy bruising or bleeding Psychiatric/Behavioral: Negative for depression, anxiety, decreased concentration, confusion   Objective:   Vitals:   01/03/16 1014  BP: 108/85  Pulse: 100  Temp: 98.7 F (37.1 C)    Physical Exam: Constitutional: Patient appears well-developed and well-nourished. No distress. HENT: Normocephalic, atraumatic, External right and left ear normal. Oropharynx is clear and moist.  Eyes: Conjunctivae and EOM are normal. PERRLA, no scleral icterus. Neck: Normal ROM. Neck supple. No lymphadenopathy, No thyromegaly. CVS: RRR, S1/S2 +, no murmurs, no gallops, no rubs Pulmonary: Effort and breath sounds normal, no stridor, rhonchi, wheezes, rales.  Abdominal: Soft. Normoactive BS,, no distension, rebound or guarding. She reports tenderness where ever I palpate on her abdomen Musculoskeletal: Normal range of motion. No edema. She reports tenderness where ever I tap on her back Neuro: Alert.Normal muscle tone coordination. Non-focal Skin: Skin is warm and dry. No rash noted. Not diaphoretic. No erythema. No pallor. Psychiatric: Normal mood and affect. Behavior, judgment, thought content normal.  Lab Results  Component Value Date   WBC 6.6 11/19/2015   HGB 12.8 11/19/2015  HCT 39.5 11/19/2015   MCV 90.4 11/19/2015   PLT 246 11/19/2015   Lab Results  Component Value Date   CREATININE 0.60 11/19/2015   BUN 15 11/19/2015   NA 136 11/19/2015   K 3.7 11/19/2015   CL 107 11/19/2015   CO2 24 11/19/2015     Lab Results  Component Value Date   HGBA1C 5.1 01/03/2016   Lipid Panel  No results found for: CHOL, TRIG, HDL, CHOLHDL, VLDL, LDLCALC     Assessment and plan:   1. Healthcare maintenance  - POCT glycosylated hemoglobin (Hb A1C) - Flu Vaccine QUAD 36+ mos PF IM (Fluarix & Fluzone Quad PF)   Return in about 1 year (around 01/02/2017).  The patient was given clear instructions to go to ER or return to medical center if symptoms don't improve, worsen or new problems develop. The patient verbalized understanding.    Henrietta HooverLinda C Bernhardt FNP  01/03/2016, 10:56 AM

## 2016-01-06 ENCOUNTER — Encounter: Payer: Self-pay | Admitting: Family

## 2016-01-06 ENCOUNTER — Ambulatory Visit (INDEPENDENT_AMBULATORY_CARE_PROVIDER_SITE_OTHER): Payer: Self-pay | Admitting: Family

## 2016-01-06 VITALS — BP 121/81 | HR 76 | Wt 140.0 lb

## 2016-01-06 DIAGNOSIS — N9489 Other specified conditions associated with female genital organs and menstrual cycle: Secondary | ICD-10-CM

## 2016-01-06 LAB — POCT URINALYSIS DIP (DEVICE)
BILIRUBIN URINE: NEGATIVE
Glucose, UA: NEGATIVE mg/dL
Ketones, ur: NEGATIVE mg/dL
NITRITE: NEGATIVE
Protein, ur: NEGATIVE mg/dL
SPECIFIC GRAVITY, URINE: 1.015 (ref 1.005–1.030)
Urobilinogen, UA: 0.2 mg/dL (ref 0.0–1.0)
pH: 5 (ref 5.0–8.0)

## 2016-01-06 MED ORDER — MEDROXYPROGESTERONE ACETATE 10 MG PO TABS: 30.0000 mg | ORAL_TABLET | Freq: Every day | ORAL | 0 refills | Status: DC

## 2016-01-06 MED ORDER — IBUPROFEN 600 MG PO TABS: 600.0000 mg | ORAL_TABLET | Freq: Three times a day (TID) | ORAL | 1 refills | Status: DC | PRN

## 2016-01-06 NOTE — Progress Notes (Signed)
   Subjective:     Samantha BurkeSehrish [name] is a 30 y.o. female who presents for evaluation of pelvic pain. The pain is described as throbbing, and is 6/10 in intensity. Pain is located in the suprapubic area with radiation to the upper abdomen. Onset was gradual occurring 4 months ago. Symptoms have been stable since. Aggravating factors: intercourse. Alleviating factors: NSAIDs. Associated symptoms: none. The patient denies dysuria, hematuria, nausea and vomiting. Risk factors for pelvic/abdominal pain include csection.  Menstrual History: OB History    Gravida Para Term Preterm AB Living   2 2 2     1    SAB TAB Ectopic Multiple Live Births         0 1      Patient's last menstrual period was 12/09/2015 (approximate).  Cycle occurs every 20-25 days.  The following portions of the patient's history were reviewed and updated as appropriate: allergies, current medications, past family history, past medical history, past social history, past surgical history and problem list.  Past Medical History:  Diagnosis Date  . Medical history non-contributory     Review of Systems Pertinent items are noted in HPI.    Objective:    BP 121/81   Pulse 76   Wt 140 lb (63.5 kg)   LMP 12/09/2015 (Approximate)   BMI 25.61 kg/m  BP 121/81   Pulse 76   Wt 140 lb (63.5 kg)   LMP 12/09/2015 (Approximate)   BMI 25.61 kg/m  General appearance: alert, cooperative and appears stated age Head: Normocephalic, without obvious abnormality, atraumatic Neck: no adenopathy, no carotid bruit, no JVD, supple, symmetrical, trachea midline and thyroid not enlarged, symmetric, no tenderness/mass/nodules Lungs: clear to auscultation bilaterally Breasts: normal appearance, no masses or tenderness, No nipple retraction or dimpling, No nipple discharge or bleeding, No axillary or supraclavicular adenopathy, Normal to palpation without dominant masses, Taught monthly breast self examination Heart: regular rate and rhythm, S1,  S2 normal, no murmur, click, rub or gallop Abdomen: soft, non-tender; bowel sounds normal; no masses,  no organomegaly Pelvic: cervix normal in appearance, external genitalia normal, no adnexal masses or tenderness, no cervical motion tenderness, rectovaginal septum normal, uterus normal size, shape, and consistency and vagina normal without discharge Skin: Skin color, texture, turgor normal. No rashes or lesions    Lab Review Results for orders placed or performed in visit on 01/06/16 (from the past 24 hour(s))  POCT urinalysis dip (device)     Status: Abnormal   Collection Time: 01/06/16  1:20 PM  Result Value Ref Range   Glucose, UA NEGATIVE NEGATIVE mg/dL   Bilirubin Urine NEGATIVE NEGATIVE   Ketones, ur NEGATIVE NEGATIVE mg/dL   Specific Gravity, Urine 1.015 1.005 - 1.030   Hgb urine dipstick SMALL (A) NEGATIVE   pH 5.0 5.0 - 8.0   Protein, ur NEGATIVE NEGATIVE mg/dL   Urobilinogen, UA 0.2 0.0 - 1.0 mg/dL   Nitrite NEGATIVE NEGATIVE   Leukocytes, UA SMALL (A) NEGATIVE    Imaging CT - Pelvis : pelvic congestion as possible diagnosis (11/2015), otherwise normal   Assessment:    Pelvic Pain - ? Pelvic Congestion Syndrome    Plan:    The diagnosis was discussed with the patient and evaluation and treatment plans outlined.   RX Provera 30 mg po QD (Up to Date); if improvement in symptoms recommend progestin only IUD RX Ibuprofen 600 mg prn Follow-up in 2-3 weeks  Gwendloyn Forsee Kennith GainN Karim, CNM

## 2016-01-06 NOTE — Patient Instructions (Addendum)
Place pelvic pain patient instructions here. Levonorgestrel intrauterine device (IUD) What is this medicine? LEVONORGESTREL IUD (LEE voe nor jes trel) is a contraceptive (birth control) device. The device is placed inside the uterus by a healthcare professional. It is used to prevent pregnancy and can also be used to treat heavy bleeding that occurs during your period. Depending on the device, it can be used for 3 to 5 years. This medicine may be used for other purposes; ask your health care provider or pharmacist if you have questions. What should I tell my health care provider before I take this medicine? They need to know if you have any of these conditions: -abnormal Pap smear -cancer of the breast, uterus, or cervix -diabetes -endometritis -genital or pelvic infection now or in the past -have more than one sexual partner or your partner has more than one partner -heart disease -history of an ectopic or tubal pregnancy -immune system problems -IUD in place -liver disease or tumor -problems with blood clots or take blood-thinners -use intravenous drugs -uterus of unusual shape -vaginal bleeding that has not been explained -an unusual or allergic reaction to levonorgestrel, other hormones, silicone, or polyethylene, medicines, foods, dyes, or preservatives -pregnant or trying to get pregnant -breast-feeding How should I use this medicine? This device is placed inside the uterus by a health care professional. Talk to your pediatrician regarding the use of this medicine in children. Special care may be needed. Overdosage: If you think you have taken too much of this medicine contact a poison control center or emergency room at once. NOTE: This medicine is only for you. Do not share this medicine with others. What if I miss a dose? This does not apply. What may interact with this medicine? Do not take this medicine with any of the following  medications: -amprenavir -bosentan -fosamprenavir This medicine may also interact with the following medications: -aprepitant -barbiturate medicines for inducing sleep or treating seizures -bexarotene -griseofulvin -medicines to treat seizures like carbamazepine, ethotoin, felbamate, oxcarbazepine, phenytoin, topiramate -modafinil -pioglitazone -rifabutin -rifampin -rifapentine -some medicines to treat HIV infection like atazanavir, indinavir, lopinavir, nelfinavir, tipranavir, ritonavir -St. John's wort -warfarin This list may not describe all possible interactions. Give your health care provider a list of all the medicines, herbs, non-prescription drugs, or dietary supplements you use. Also tell them if you smoke, drink alcohol, or use illegal drugs. Some items may interact with your medicine. What should I watch for while using this medicine? Visit your doctor or health care professional for regular check ups. See your doctor if you or your partner has sexual contact with others, becomes HIV positive, or gets a sexual transmitted disease. This product does not protect you against HIV infection (AIDS) or other sexually transmitted diseases. You can check the placement of the IUD yourself by reaching up to the top of your vagina with clean fingers to feel the threads. Do not pull on the threads. It is a good habit to check placement after each menstrual period. Call your doctor right away if you feel more of the IUD than just the threads or if you cannot feel the threads at all. The IUD may come out by itself. You may become pregnant if the device comes out. If you notice that the IUD has come out use a backup birth control method like condoms and call your health care provider. Using tampons will not change the position of the IUD and are okay to use during your period. What side effects may  I notice from receiving this medicine? Side effects that you should report to your doctor or  health care professional as soon as possible: -allergic reactions like skin rash, itching or hives, swelling of the face, lips, or tongue -fever, flu-like symptoms -genital sores -high blood pressure -no menstrual period for 6 weeks during use -pain, swelling, warmth in the leg -pelvic pain or tenderness -severe or sudden headache -signs of pregnancy -stomach cramping -sudden shortness of breath -trouble with balance, talking, or walking -unusual vaginal bleeding, discharge -yellowing of the eyes or skin Side effects that usually do not require medical attention (report to your doctor or health care professional if they continue or are bothersome): -acne -breast pain -change in sex drive or performance -changes in weight -cramping, dizziness, or faintness while the device is being inserted -headache -irregular menstrual bleeding within first 3 to 6 months of use -nausea This list may not describe all possible side effects. Call your doctor for medical advice about side effects. You may report side effects to FDA at 1-800-FDA-1088. Where should I keep my medicine? This does not apply. NOTE: This sheet is a summary. It may not cover all possible information. If you have questions about this medicine, talk to your doctor, pharmacist, or health care provider.    2016, Elsevier/Gold Standard. (2011-06-01 13:54:04)

## 2016-02-01 ENCOUNTER — Ambulatory Visit: Payer: No Typology Code available for payment source | Admitting: Family

## 2016-06-16 ENCOUNTER — Ambulatory Visit: Payer: No Typology Code available for payment source | Admitting: Family Medicine

## 2016-06-19 ENCOUNTER — Ambulatory Visit: Payer: No Typology Code available for payment source | Admitting: Family Medicine

## 2016-06-29 ENCOUNTER — Ambulatory Visit (HOSPITAL_COMMUNITY)
Admission: EM | Admit: 2016-06-29 | Discharge: 2016-06-29 | Disposition: A | Discharge status: Home / Self Care | Payer: No Typology Code available for payment source

## 2016-11-30 ENCOUNTER — Ambulatory Visit (INDEPENDENT_AMBULATORY_CARE_PROVIDER_SITE_OTHER): Payer: No Typology Code available for payment source | Admitting: Family Medicine

## 2016-11-30 ENCOUNTER — Encounter: Payer: Self-pay | Admitting: Family Medicine

## 2016-11-30 VITALS — BP 118/84 | HR 76 | Ht 62.0 in | Wt 145.0 lb

## 2016-11-30 DIAGNOSIS — L659 Nonscarring hair loss, unspecified: Secondary | ICD-10-CM

## 2016-11-30 DIAGNOSIS — R519 Headache, unspecified: Secondary | ICD-10-CM

## 2016-11-30 DIAGNOSIS — K219 Gastro-esophageal reflux disease without esophagitis: Secondary | ICD-10-CM

## 2016-11-30 DIAGNOSIS — R51 Headache: Secondary | ICD-10-CM

## 2016-11-30 LAB — CBC WITH DIFFERENTIAL/PLATELET
BASOS ABS: 0 {cells}/uL (ref 0–200)
Basophils Relative: 0 %
EOS ABS: 58 {cells}/uL (ref 15–500)
Eosinophils Relative: 1 %
HCT: 41.5 % (ref 35.0–45.0)
HEMOGLOBIN: 13.6 g/dL (ref 11.7–15.5)
LYMPHS PCT: 38 %
Lymphs Abs: 2204 cells/uL (ref 850–3900)
MCH: 29.4 pg (ref 27.0–33.0)
MCHC: 32.8 g/dL (ref 32.0–36.0)
MCV: 89.6 fL (ref 80.0–100.0)
MONO ABS: 464 {cells}/uL (ref 200–950)
MPV: 10.6 fL (ref 7.5–12.5)
Monocytes Relative: 8 %
NEUTROS PCT: 53 %
Neutro Abs: 3074 cells/uL (ref 1500–7800)
Platelets: 259 10*3/uL (ref 140–400)
RBC: 4.63 MIL/uL (ref 3.80–5.10)
RDW: 13.1 % (ref 11.0–15.0)
WBC: 5.8 10*3/uL (ref 3.8–10.8)

## 2016-11-30 MED ORDER — BUTALBITAL-APAP-CAFFEINE 50-325-40 MG PO TABS: 1.0000 | ORAL_TABLET | Freq: Four times a day (QID) | ORAL | 0 refills | Status: AC | PRN

## 2016-11-30 MED ORDER — RANITIDINE HCL 150 MG PO TABS: 150.0000 mg | ORAL_TABLET | Freq: Two times a day (BID) | ORAL | 0 refills | Status: DC

## 2016-11-30 NOTE — Progress Notes (Signed)
Patient ID: Samantha [name], female    DOB: 06-27-1985, 31 y.o.   MRN: [id number]  PCP: Samantha NeighborsHarris, Natoria Archibald S, FNP  No chief complaint on file.   Subjective:  HPI Live Interpreter Present  Samantha [name] is a 31 y.o. female presents for evaluation of headaches. Medical history includes: Pregnancy with vaginal delivery only. Samantha [name] reports over the last month experiencing unilateral headaches almost daily. Headaches occur often with feelings of stress or sadness. Denies any specific reason or situations related to stress or sadness. Headache pain has been relieved with over the counter Motrin or Aleve, however medication causes GI upset. Samantha [name] denies associated focal symptoms of weakness, visual disturbances, or dizziness. Other complain today, patient reports a history of epigastric pain associated with certain types of food intake. Reports associated bloating and flatus. No known prior diagnosis of acid reflux. She has not attempted relief of epigastric pain or bloating with medication. Samantha [name] has a third complaint at the close of the visit of hair loss. This has been occurring intermittently. Reports that hair falling out in handfuls.  Social History   Social History  . Marital status: Married    Spouse name: N/A  . Number of children: N/A  . Years of education: N/A   Occupational History  . Not on file.   Social History Main Topics  . Smoking status: Never Smoker  . Smokeless tobacco: Never Used  . Alcohol use No  . Drug use: No  . Sexual activity: Yes    Birth control/ protection: None   Other Topics Concern  . Not on file   Social History Narrative  . No narrative on file    Family History  Problem Relation Age of Onset  . Asthma Father    Review of Systems See HPI  Patient Active Problem List   Diagnosis Date Noted  . Amniotic fluid leaking 11/19/2014  . Known or suspected fetal anomaly affecting care of mother, antepartum 09/29/2014  . Supervision of normal  pregnancy 07/30/2014  . Previous cesarean delivery, antepartum 07/30/2014    No Known Allergies  Prior to Admission medications   Medication Sig Start Date End Date Taking? Authorizing Provider  ibuprofen (ADVIL,MOTRIN) 600 MG tablet Take 1 tablet (600 mg total) by mouth every 8 (eight) hours as needed for moderate pain. 01/06/16  Yes Marlis EdelsonKarim, Walidah N, CNM  Past Medical, Surgical Family and Social History reviewed and updated.  Objective:   Today'[name] Vitals   11/30/16 1527  BP: 118/84  Pulse: 76  SpO2: 94%  Weight: 145 lb (65.8 kg)  Height: 5\' 2"  (1.575 m)    Wt Readings from Last 3 Encounters:  11/30/16 145 lb (65.8 kg)  01/06/16 140 lb (63.5 kg)  01/03/16 142 lb (64.4 kg)   Physical Exam  Constitutional: She is oriented to person, place, and time. She appears well-developed and well-nourished.  HENT:  Head: Normocephalic and atraumatic.  Eyes: Pupils are equal, round, and reactive to light. Conjunctivae and EOM are normal.  Neck: Normal range of motion. Neck supple.  Cardiovascular: Normal rate, regular rhythm, normal heart sounds and intact distal pulses.   Pulmonary/Chest: Effort normal and breath sounds normal.  Musculoskeletal: Normal range of motion.  Neurological: She is alert and oriented to person, place, and time. She displays normal reflexes. No cranial nerve deficit. Coordination normal.  Skin: Skin is warm and dry.  Negative visible alopecia  Psychiatric: She has a normal mood and affect. Her behavior is normal. Judgment and thought content normal.  Assessment & Plan:  1. Generalized headaches - COMPLETE METABOLIC PANEL WITH GFR -Butalbital-Acetaminophen-Caffeine 50-325-40, 1 tablet every 6 hours as needed for headache.  2. Gastroesophageal reflux disease without esophagitis - CBC with Differential -Ranitidine 150 mg twice daily as needed for symptoms.  3. Hair loss - Thyroid Panel With TSH  RTC: 6 week follow-up for headaches.   Godfrey Pick. Tiburcio Pea,  MSN, FNP-C [facility]  [address] [place], [place] [address] [phone]

## 2016-11-30 NOTE — Patient Instructions (Addendum)
For acid reflux start ranitidine 150 mg BID.  For headaches I am starting you on Firocet. Medication may cause drowsiness, therefore avoid driving while taking medication.  You will be notified of any abnormal labs.    General Headache Without Cause A headache is pain or discomfort felt around the head or neck area. There are many causes and types of headaches. In some cases, the cause may not be found. Follow these instructions at home: Managing pain  Take over-the-counter and prescription medicines only as told by your doctor.  Lie down in a dark, quiet room when you have a headache.  If directed, apply ice to the head and neck area: ? Put ice in a plastic bag. ? Place a towel between your skin and the bag. ? Leave the ice on for 20 minutes, 2-3 times per day.  Use a heating pad or hot shower to apply heat to the head and neck area as told by your doctor.  Keep lights dim if bright lights bother you or make your headaches worse. Eating and drinking  Eat meals on a regular schedule.  Lessen how much alcohol you drink.  Lessen how much caffeine you drink, or stop drinking caffeine. General instructions  Keep all follow-up visits as told by your doctor. This is important.  Keep a journal to find out if certain things bring on headaches. For example, write down: ? What you eat and drink. ? How much sleep you get. ? Any change to your diet or medicines.  Relax by getting a massage or doing other relaxing activities.  Lessen stress.  Sit up straight. Do not tighten (tense) your muscles.  Do not use tobacco products. This includes cigarettes, chewing tobacco, or e-cigarettes. If you need help quitting, ask your doctor.  Exercise regularly as told by your doctor.  Get enough sleep. This often means 7-9 hours of sleep. Contact a doctor if:  Your symptoms are not helped by medicine.  You have a headache that feels different than the other headaches.  You feel sick to  your stomach (nauseous) or you throw up (vomit).  You have a fever. Get help right away if:  Your headache becomes really bad.  You keep throwing up.  You have a stiff neck.  You have trouble seeing.  You have trouble speaking.  You have pain in the eye or ear.  Your muscles are weak or you lose muscle control.  You lose your balance or have trouble walking.  You feel like you will pass out (faint) or you pass out.  You have confusion. This information is not intended to replace advice given to you by your health care provider. Make sure you discuss any questions you have with your health care provider. Document Released: 02/08/2008 Document Revised: 10/07/2015 Document Reviewed: 08/24/2014 Elsevier Interactive Patient Education  [employer]2018 Elsevier Inc.

## 2016-12-01 LAB — COMPLETE METABOLIC PANEL WITH GFR
ALBUMIN: 4.3 g/dL (ref 3.6–5.1)
ALK PHOS: 47 U/L (ref 33–115)
ALT: 13 U/L (ref 6–29)
AST: 15 U/L (ref 10–30)
BUN: 12 mg/dL (ref 7–25)
CALCIUM: 9.7 mg/dL (ref 8.6–10.2)
CHLORIDE: 105 mmol/L (ref 98–110)
CO2: 17 mmol/L — ABNORMAL LOW (ref 20–31)
Creat: 0.7 mg/dL (ref 0.50–1.10)
Glucose, Bld: 88 mg/dL (ref 65–99)
POTASSIUM: 4.4 mmol/L (ref 3.5–5.3)
Sodium: 139 mmol/L (ref 135–146)
Total Bilirubin: 0.3 mg/dL (ref 0.2–1.2)
Total Protein: 7.7 g/dL (ref 6.1–8.1)

## 2016-12-01 LAB — THYROID PANEL WITH TSH
Free Thyroxine Index: 2.4 (ref 1.4–3.8)
T3 UPTAKE: 27 % (ref 22–35)
T4 TOTAL: 8.8 ug/dL (ref 4.5–12.0)
TSH: 1.12 m[IU]/L

## 2017-01-12 ENCOUNTER — Ambulatory Visit: Payer: No Typology Code available for payment source | Admitting: Family Medicine

## 2017-01-25 ENCOUNTER — Ambulatory Visit: Payer: No Typology Code available for payment source | Admitting: Family Medicine

## 2017-01-26 ENCOUNTER — Ambulatory Visit: Payer: No Typology Code available for payment source | Admitting: Family Medicine

## 2017-02-05 ENCOUNTER — Encounter: Payer: Self-pay | Admitting: Family Medicine

## 2017-02-05 ENCOUNTER — Ambulatory Visit (INDEPENDENT_AMBULATORY_CARE_PROVIDER_SITE_OTHER): Payer: No Typology Code available for payment source | Admitting: Family Medicine

## 2017-02-05 VITALS — BP 106/76 | HR 89 | Temp 97.5°F | Resp 12 | Ht 62.0 in | Wt 149.0 lb

## 2017-02-05 DIAGNOSIS — G43009 Migraine without aura, not intractable, without status migrainosus: Secondary | ICD-10-CM

## 2017-02-05 DIAGNOSIS — Z23 Encounter for immunization: Secondary | ICD-10-CM

## 2017-02-05 MED ORDER — TOPIRAMATE 25 MG PO TABS: ORAL_TABLET | ORAL | 1 refills | Status: DC

## 2017-02-05 MED ORDER — SUMATRIPTAN SUCCINATE 50 MG PO TABS: 50.0000 mg | ORAL_TABLET | ORAL | 0 refills | Status: DC | PRN

## 2017-02-05 MED ORDER — RANITIDINE HCL 150 MG PO TABS
150.0000 mg | ORAL_TABLET | Freq: Two times a day (BID) | ORAL | 2 refills | Status: DC
Start: 2017-02-05 — End: 2017-09-15

## 2017-02-05 NOTE — Progress Notes (Signed)
Patient ID: Samantha [name], female    DOB: 01-15-1986, 31 y.o.   MRN: [id number]  PCP: Bing Neighbors, FNP  Chief Complaint  Patient presents with  . Follow-up    6 weeks    Subjective:  HPI Samantha [name] is a 31 y.o. female presents for evaluation of headaches. She was seen 11/30/2016 for generalized headaches and prescribed Fioricet. She reports today no improvement of headaches.  Continue to experience headaches which she characterizes as throbbing and painful. Chaquetta reports associated sensitivity to noise and light. She estimates headaches occur on  average about 9-10 times per month. Denies nausea or vomiting. Rosalynd has attempted relief with Excedrin and ranitidine with only mild relief of headache pain. Social History   Social History  . Marital status: Married    Spouse name: N/A  . Number of children: N/A  . Years of education: N/A   Occupational History  . Not on file.   Social History Main Topics  . Smoking status: Never Smoker  . Smokeless tobacco: Never Used  . Alcohol use No  . Drug use: No  . Sexual activity: Yes    Birth control/ protection: None   Other Topics Concern  . Not on file   Social History Narrative  . No narrative on file    Family History  Problem Relation Age of Onset  . Asthma Father    Review of Systems See HPI  Patient's last menstrual period was 01/01/2017.  Patient Active Problem List   Diagnosis Date Noted  . Amniotic fluid leaking 11/19/2014  . Known or suspected fetal anomaly affecting care of mother, antepartum 09/29/2014  . Supervision of normal pregnancy 07/30/2014  . Previous cesarean delivery, antepartum 07/30/2014    No Known Allergies  Prior to Admission medications   Medication Sig Start Date End Date Taking? Authorizing Provider  ibuprofen (ADVIL,MOTRIN) 600 MG tablet Take 1 tablet (600 mg total) by mouth every 8 (eight) hours as needed for moderate pain. Patient not taking: Reported on 02/05/2017 01/06/16    Marlis Edelson, CNM  ranitidine (ZANTAC) 150 MG tablet Take 1 tablet (150 mg total) by mouth 2 (two) times daily. Patient not taking: Reported on 02/05/2017 11/30/16   Bing Neighbors, FNP    Past Medical, Surgical Family and Social History reviewed and updated.    Objective:   Today's Vitals   02/05/17 0844  BP: 106/76  Pulse: 89  Resp: 12  Temp: (!) 97.5 F (36.4 C)  TempSrc: Oral  SpO2: 99%  Weight: 149 lb (67.6 kg)  Height:  (1.575 m)    Wt Readings from Last 3 Encounters:  02/05/17 149 lb (67.6 kg)  11/30/16 145 lb (65.8 kg)  01/06/16 140 lb (63.5 kg)   Physical Exam  Constitutional: She is oriented to person, place, and time. She appears well-developed and well-nourished.  HENT:  Head: Normocephalic and atraumatic.  Eyes: Pupils are equal, round, and reactive to light. Conjunctivae and EOM are normal.  Neck: Normal range of motion. Neck supple.  Cardiovascular: Normal rate, regular rhythm, normal heart sounds and intact distal pulses.   Pulmonary/Chest: Effort normal and breath sounds normal.  Musculoskeletal: Normal range of motion.  Neurological: She is alert and oriented to person, place, and time. Coordination normal.  Skin: Skin is warm and dry.  Psychiatric: She has a normal mood and affect. Her behavior is normal. Judgment and thought content normal.   Assessment & Plan:  1. Migraine without aura and without  status migrainosus, not intractable, unremarkable neurological exam. Will trial patient on sumatriptan (abortive therapy) and Topamax (prevenative therapy). Advised patient to discontinue medication if she considers becoming pregnant.    Need for influenza vaccination - Flu Vaccine QUAD 36+ mos IM  RTC: 2 weeks for evaluation of headaches and irregular menstrual cycle.  Godfrey Pick. Tiburcio Pea, MSN, FNP-C [facility]  [address] [place], [place] [address] [phone]

## 2017-02-05 NOTE — Patient Instructions (Addendum)
Imitrex 50 mg at the onset of headache and may repeat dose within 1 hour. This medication is as needed only.  You will take Topiramate for headache prevention Start: 25 mg, 1 pill at bedtime for 7 days. Increase to 25 mg twice daily after 7 days.  Do not abruptly stop medication.  Return for follow-up in 4 weeks to reevaluate treatment.   Migraine Headache A migraine headache is a very strong throbbing pain on one side or both sides of your head. Migraines can also cause other symptoms. Talk with your doctor about what things may bring on (trigger) your migraine headaches. Follow these instructions at home: Medicines  Take over-the-counter and prescription medicines only as told by your doctor.  Do not drive or use heavy machinery while taking prescription pain medicine.  To prevent or treat constipation while you are taking prescription pain medicine, your doctor may recommend that you: ? Drink enough fluid to keep your pee (urine) clear or pale yellow. ? Take over-the-counter or prescription medicines. ? Eat foods that are high in fiber. These include fresh fruits and vegetables, whole grains, and beans. ? Limit foods that are high in fat and processed sugars. These include fried and sweet foods. Lifestyle  Avoid alcohol.  Do not use any products that contain nicotine or tobacco, such as cigarettes and e-cigarettes. If you need help quitting, ask your doctor.  Get at least 8 hours of sleep every night.  Limit your stress. General instructions   Keep a journal to find out what may bring on your migraines. For example, write down: ? What you eat and drink. ? How much sleep you get. ? Any change in what you eat or drink. ? Any change in your medicines.  If you have a migraine: ? Avoid things that make your symptoms worse, such as bright lights. ? It may help to lie down in a dark, quiet room. ? Do not drive or use heavy machinery. ? Ask your doctor what activities are safe  for you.  Keep all follow-up visits as told by your doctor. This is important. Contact a doctor if:  You get a migraine that is different or worse than your usual migraines. Get help right away if:  Your migraine gets very bad.  You have a fever.  You have a stiff neck.  You have trouble seeing.  Your muscles feel weak or like you cannot control them.  You start to lose your balance a lot.  You start to have trouble walking.  You pass out (faint). This information is not intended to replace advice given to you by your health care provider. Make sure you discuss any questions you have with your health care provider. Document Released: 02/08/2008 Document Revised: 11/19/2015 Document Reviewed: 10/18/2015 Elsevier Interactive Patient Education  2017 [employer].

## 2017-03-05 ENCOUNTER — Ambulatory Visit: Payer: No Typology Code available for payment source | Admitting: Family Medicine

## 2017-03-05 ENCOUNTER — Ambulatory Visit (INDEPENDENT_AMBULATORY_CARE_PROVIDER_SITE_OTHER): Payer: No Typology Code available for payment source | Admitting: Family Medicine

## 2017-03-05 ENCOUNTER — Encounter: Payer: Self-pay | Admitting: Family Medicine

## 2017-03-05 VITALS — BP 110/64 | HR 84 | Temp 97.8°F | Ht 62.0 in | Wt 151.8 lb

## 2017-03-05 DIAGNOSIS — G43009 Migraine without aura, not intractable, without status migrainosus: Secondary | ICD-10-CM

## 2017-03-05 NOTE — Progress Notes (Signed)
Patient ID: Samantha [name], female    DOB: 05-22-1985, 31 y.o.   MRN: [id number]  PCP: Bing Neighbors, FNP  Chief Complaint  Patient presents with  . Follow-up    HEADACHES    Subjective:  HPI- Interpreter present during visit  Samantha [name] is a 31 y.o. female presents for evaluation of chronic migraine headaches.Reports that headaches are still occurring on average of 4 times per month. She has taken not consistently taken Topamax daily, although has attempted relief with Imitrex at the onset of headaches without relief. When headaches occur, she reports needing to lay down in a dark and quiet room and sleep until headache resolves. The headaches are often occurring in the frontal region of head. She denies any associated visual disturbances, nausea, or vomiting.  Social History   Social History  . Marital status: Married    Spouse name: N/A  . Number of children: N/A  . Years of education: N/A   Occupational History  . Not on file.   Social History Main Topics  . Smoking status: Never Smoker  . Smokeless tobacco: Never Used  . Alcohol use No  . Drug use: No  . Sexual activity: Yes    Birth control/ protection: None   Other Topics Concern  . Not on file   Social History Narrative  . No narrative on file    Family History  Problem Relation Age of Onset  . Asthma Father    Review of Systems See HPI  Patient Active Problem List   Diagnosis Date Noted  . Amniotic fluid leaking 11/19/2014  . Known or suspected fetal anomaly affecting care of mother, antepartum 09/29/2014  . Supervision of normal pregnancy 07/30/2014  . Previous cesarean delivery, antepartum 07/30/2014    No Known Allergies  Prior to Admission medications   Medication Sig Start Date End Date Taking? Authorizing Provider  ibuprofen (ADVIL,MOTRIN) 600 MG tablet Take 1 tablet (600 mg total) by mouth every 8 (eight) hours as needed for moderate pain. 01/06/16  Yes Elenora Fender, Walidah N, CNM  ranitidine  (ZANTAC) 150 MG tablet Take 1 tablet (150 mg total) by mouth 2 (two) times daily. 02/05/17  Yes Bing Neighbors, FNP  SUMAtriptan (IMITREX) 50 MG tablet Take 1 tablet (50 mg total) by mouth every 2 (two) hours as needed for migraine. May repeat in 2 hours if headache persists or recurs. 02/05/17  Yes Bing Neighbors, FNP  topiramate (TOPAMAX) 25 MG tablet Topiramate for headache prevention Start: 25 mg, 1 pill at bedtime for 7 days. Increase to 25 mg twice daily after 7 days. Do not abruptly stop medication. 02/05/17  Yes Bing Neighbors, FNP    Past Medical, Surgical Family and Social History reviewed and updated.    Objective:   Today's Vitals   03/05/17 0820  BP: 110/64  Pulse: 84  Temp: 97.8 F (36.6 C)  TempSrc: Oral  SpO2: 100%  Weight: 151 lb 12.8 oz (68.9 kg)  Height: 5\' 2"  (1.575 m)    Wt Readings from Last 3 Encounters:  03/05/17 151 lb 12.8 oz (68.9 kg)  02/05/17 149 lb (67.6 kg)  11/30/16 145 lb (65.8 kg)    Physical Exam  Constitutional: She is oriented to person, place, and time. She appears well-developed and well-nourished.  HENT:  Head: Normocephalic.  Eyes: Pupils are equal, round, and reactive to light. Conjunctivae are normal.  Neck: Normal range of motion. Neck supple. No thyromegaly present.  Cardiovascular: Normal rate, normal heart  sounds and intact distal pulses.   Pulmonary/Chest: Effort normal and breath sounds normal.  Musculoskeletal: Normal range of motion.  Lymphadenopathy:    She has no cervical adenopathy.  Neurological: She is alert and oriented to person, place, and time.  Skin: Skin is warm and dry.  Psychiatric: She has a normal mood and affect. Her behavior is normal. Judgment and thought content normal.   Assessment & Plan:  1. Migraine without aura and without status migrainosus, not intractable. Counseled patient on the importance of headache preventative therapy. Resume Topamax as prescribed. Continue Imitrex at the onset of  headaches. -AMB referral to headache clinic  RTC: 6 months for routine wellness follow-up   Godfrey PickKimberly S. Tiburcio PeaHarris, MSN, FNP-C [name]  [address] [place], [place] [id number] [phone]

## 2017-03-23 ENCOUNTER — Ambulatory Visit: Payer: Self-pay | Admitting: Neurology

## 2017-04-30 ENCOUNTER — Ambulatory Visit: Payer: No Typology Code available for payment source | Admitting: Neurology

## 2017-04-30 ENCOUNTER — Encounter: Payer: Self-pay | Admitting: Neurology

## 2017-04-30 DIAGNOSIS — G43009 Migraine without aura, not intractable, without status migrainosus: Secondary | ICD-10-CM | POA: Insufficient documentation

## 2017-04-30 MED ORDER — VENLAFAXINE HCL ER 75 MG PO CP24: 75.0000 mg | ORAL_CAPSULE | Freq: Every day | ORAL | 6 refills | Status: DC

## 2017-04-30 NOTE — Patient Instructions (Signed)
Venlafaxine extended-release capsules What is this medicine? VENLAFAXINE(VEN la fax een) is used to treat Migraines This medicine may be used for other purposes; ask your health care provider or pharmacist if you have questions. COMMON BRAND NAME(S): Effexor XR What should I tell my health care provider before I take this medicine? They need to know if you have any of these conditions: -bleeding disorders -glaucoma -heart disease -high blood pressure -high cholesterol -kidney disease -liver disease -low levels of sodium in the blood -mania or bipolar disorder -seizures -suicidal thoughts, plans, or attempt; a previous suicide attempt by you or a family -take medicines that treat or prevent blood clots -thyroid disease -an unusual or allergic reaction to venlafaxine, desvenlafaxine, other medicines, foods, dyes, or preservatives -pregnant or trying to get pregnant -breast-feeding How should I use this medicine? Take this medicine by mouth with a full glass of water. Follow the directions on the prescription label. Do not cut, crush, or chew this medicine. Take it with food. If needed, the capsule may be carefully opened and the entire contents sprinkled on a spoonful of cool applesauce. Swallow the applesauce/pellet mixture right away without chewing and follow with a glass of water to ensure complete swallowing of the pellets. Try to take your medicine at about the same time each day. Do not take your medicine more often than directed. Do not stop taking this medicine suddenly except upon the advice of your doctor. Stopping this medicine too quickly may cause serious side effects or your condition may worsen. A special MedGuide will be given to you by the pharmacist with each prescription and refill. Be sure to read this information carefully each time. Talk to your pediatrician regarding the use of this medicine in children. Special care may be needed. Overdosage: If you think you have  taken too much of this medicine contact a poison control center or emergency room at once. NOTE: This medicine is only for you. Do not share this medicine with others. What if I miss a dose? If you miss a dose, take it as soon as you can. If it is almost time for your next dose, take only that dose. Do not take double or extra doses. What may interact with this medicine? Do not take this medicine with any of the following medications: -certain medicines for fungal infections like fluconazole, itraconazole, ketoconazole, posaconazole, voriconazole -cisapride -desvenlafaxine -dofetilide -dronedarone -duloxetine -levomilnacipran -linezolid -MAOIs like Carbex, Eldepryl, Marplan, Nardil, and Parnate -methylene blue (injected into a vein) -milnacipran -pimozide -thioridazine -ziprasidone This medicine may also interact with the following medications: -amphetamines -aspirin and aspirin-like medicines -certain medicines for depression, anxiety, or psychotic disturbances -certain medicines for migraine headaches like almotriptan, eletriptan, frovatriptan, naratriptan, rizatriptan, sumatriptan, zolmitriptan -certain medicines for sleep -certain medicines that treat or prevent blood clots like dalteparin, enoxaparin, warfarin -cimetidine -clozapine -diuretics -fentanyl -furazolidone -indinavir -isoniazid -lithium -metoprolol -NSAIDS, medicines for pain and inflammation, like ibuprofen or naproxen -other medicines that prolong the QT interval (cause an abnormal heart rhythm) -procarbazine -rasagiline -supplements like St. John's wort, kava kava, valerian -tramadol -tryptophan This list may not describe all possible interactions. Give your health care provider a list of all the medicines, herbs, non-prescription drugs, or dietary supplements you use. Also tell them if you smoke, drink alcohol, or use illegal drugs. Some items may interact with your medicine. What should I watch for  while using this medicine? Tell your doctor if your symptoms do not get better or if they get worse. Visit your doctor  or health care professional for regular checks on your progress. Because it may take several weeks to see the full effects of this medicine, it is important to continue your treatment as prescribed by your doctor. Patients and their families should watch out for new or worsening thoughts of suicide or depression. Also watch out for sudden changes in feelings such as feeling anxious, agitated, panicky, irritable, hostile, aggressive, impulsive, severely restless, overly excited and hyperactive, or not being able to sleep. If this happens, especially at the beginning of treatment or after a change in dose, call your health care professional. This medicine can cause an increase in blood pressure. Check with your doctor for instructions on monitoring your blood pressure while taking this medicine. You may get drowsy or dizzy. Do not drive, use machinery, or do anything that needs mental alertness until you know how this medicine affects you. Do not stand or sit up quickly, especially if you are an older patient. This reduces the risk of dizzy or fainting spells. Alcohol may interfere with the effect of this medicine. Avoid alcoholic drinks. Your mouth may get dry. Chewing sugarless gum, sucking hard candy and drinking plenty of water will help. Contact your doctor if the problem does not go away or is severe. What side effects may I notice from receiving this medicine? Side effects that you should report to your doctor or health care professional as soon as possible: -allergic reactions like skin rash, itching or hives, swelling of the face, lips, or tongue -anxious -breathing problems -confusion -changes in vision -chest pain -confusion -elevated mood, decreased need for sleep, racing thoughts, impulsive behavior -eye pain -fast, irregular heartbeat -feeling faint or lightheaded,  falls -feeling agitated, angry, or irritable -hallucination, loss of contact with reality -high blood pressure -loss of balance or coordination -palpitations -redness, blistering, peeling or loosening of the skin, including inside the mouth -restlessness, pacing, inability to keep still -seizures -stiff muscles -suicidal thoughts or other mood changes -trouble passing urine or change in the amount of urine -trouble sleeping -unusual bleeding or bruising -unusually weak or tired -vomiting Side effects that usually do not require medical attention (report to your doctor or health care professional if they continue or are bothersome): -change in sex drive or performance -change in appetite or weight -constipation -dizziness -dry mouth -headache -increased sweating -nausea -tired This list may not describe all possible side effects. Call your doctor for medical advice about side effects. You may report side effects to FDA at 1-800-FDA-1088. Where should I keep my medicine? Keep out of the reach of children. Store at a controlled temperature between 20 and 25 degrees C (68 degrees and 77 degrees F), in a dry place. Throw away any unused medicine after the expiration date. NOTE: This sheet is a summary. It may not cover all possible information. If you have questions about this medicine, talk to your doctor, pharmacist, or health care provider.  2018 Elsevier/Gold Standard (2015-09-30 18:38:02)

## 2017-04-30 NOTE — Progress Notes (Signed)
[facility]    Provider:  Dr Lucia Gaskins Referring Provider: Bing Neighbors, FNP Primary Care Physician:  Bing Neighbors, FNP  CC:  Migraines  HPI:  Samantha [name] is a 30 y.o. female here as a referral from Dr. Tiburcio Pea for migrianes. She is here with an interpreter. PMHx depression, migraine. She has been non cpm[liant with medication, she did not take her Topamax every day and stopped it on her own. She only took a few and it didn;t help so she stopped. Explained that medication takes 8-12 weeks, she can;t stop it. She takes Excedrin which helps her headaches. Her  Headaches are throbbing around the eyes, with light and sound sensitivity. She has had headaches for years. They have no changed in quality. She has migraines 4-5 days a month. She says she is headache free 25 days a month. She can take Excedrin as long as she takes it only 4-5 times a month. Discussed medication rebound, as long as she doesn't take it more than 10 days in a month. She has kids at home. She endorses depression, sadness, she is sad some most days and she does cry. No vision changes, no paresthesias or weakness, no changes in quality, severity has improved from July, no red flags. Headaches started as child.   Reviewed notes, labs and imaging from outside physicians, which showed:  CMP unremarkable, tsh nml  Reviewed referring physician notes.  Patient was seen multiple times in July September and October for headaches.  Patient describing unilateral headaches almost daily, in the setting of stress or sadness, been taking Motrin or Aleve, which has caused GI upset and she is been to the emergency room multiple times in the last year or 2 for abdominal pain.  She also describes light sensitivity, sound sensitivity, nausea.  She was given Fioricet which did not improve symptoms.  Headaches are throbbing and painful.  At initial appointment the headaches were daily, in September she described 9-10 times  per month.  Taking Excedrin.  In October she described them as 4 times per month.  She was prescribed Topamax and Imitrex.  Lying down in a dark room helps.  Headaches are frontal.  No associated visual disturbances, vomiting.  Review of Systems: Patient complains of symptoms per HPI as well as the following symptoms: blurred vision, headache, dizziness, joint pain, allergies. Pertinent negatives and positives per HPI. All others negative.   Social History   Socioeconomic History  . Marital status: Married    Spouse name: Not on file  . Number of children: Not on file  . Years of education: Not on file  . Highest education level: Not on file  Social Needs  . Financial resource strain: Not on file  . Food insecurity - worry: Not on file  . Food insecurity - inability: Not on file  . Transportation needs - medical: Not on file  . Transportation needs - non-medical: Not on file  Occupational History  . Not on file  Tobacco Use  . Smoking status: Never Smoker  . Smokeless tobacco: Never Used  Substance and Sexual Activity  . Alcohol use: No  . Drug use: No  . Sexual activity: Yes    Birth control/protection: None  Other Topics Concern  . Not on file  Social History Narrative  . Not on file    Family History  Problem Relation Age of Onset  . Asthma Father   . Migraines Mother     Past Medical History:  Diagnosis Date  . Medical history non-contributory   . Migraine     Past Surgical History:  Procedure Laterality Date  . CESAREAN SECTION      Current Outpatient Medications  Medication Sig Dispense Refill  . aspirin-acetaminophen-caffeine (EXCEDRIN MIGRAINE) 250-250-65 MG tablet Take by mouth every 6 (six) hours as needed for headache.    . ranitidine (ZANTAC) 150 MG tablet Take 1 tablet (150 mg total) by mouth 2 (two) times daily. 60 tablet 2  . venlafaxine XR (EFFEXOR XR) 75 MG 24 hr capsule Take 1 capsule (75 mg total) by mouth daily with breakfast. 30 capsule 6     No current facility-administered medications for this visit.     Allergies as of 04/30/2017  . (No Known Allergies)    Vitals: BP 122/76   Pulse 90   Ht 5\' 1"  (1.549 m)   Wt 156 lb (70.8 kg)   BMI 29.48 kg/m  Last Weight:  Wt Readings from Last 1 Encounters:  04/30/17 156 lb (70.8 kg)   Last Height:   Ht Readings from Last 1 Encounters:  04/30/17 5\' 1"  (1.549 m)   Physical exam: Exam: Gen: NAD, conversant, well nourised, well groomed                     CV: RRR, no MRG. No Carotid Bruits. No peripheral edema, warm, nontender Eyes: Conjunctivae clear without exudates or hemorrhage  Neuro: Detailed Neurologic Exam  Speech:    Speech is normal; fluent and spontaneous with normal comprehension.  Cognition:    The patient is oriented to person, place, and time;     recent and remote memory intact;     language fluent;     normal attention, concentration,     fund of knowledge Cranial Nerves:    The pupils are equal, round, and reactive to light. The fundi are normal and spontaneous venous pulsations are present. Visual fields are full to finger confrontation. Extraocular movements are intact. Trigeminal sensation is intact and the muscles of mastication are normal. The face is symmetric. The palate elevates in the midline. Hearing intact. Voice is normal. Shoulder shrug is normal. The tongue has normal motion without fasciculations.   Coordination:    Normal finger to nose and heel to shin. Normal rapid alternating movements.   Gait:    Heel-toe and tandem gait are normal.   Motor Observation:    No asymmetry, no atrophy, and no involuntary movements noted. Tone:    Normal muscle tone.    Posture:    Posture is normal. normal erect    Strength:    Strength is V/V in the upper and lower limbs.      Sensation: intact to LT     Reflex Exam:  DTR's:    Deep tendon reflexes in the upper and lower extremities are normal bilaterally.   Toes:    The toes are  downgoing bilaterally.   Clonus:    Clonus is absent.       Assessment/Plan:  31 year old with migraines in the setting of depression  Excedrin for acute management is fine as long as she does not tak it more than 6-7x a month No insurance, will have our office help with [facility] Financial Assistance Start Effexor for migraines and depression, discussed side effects Consider imaging, she cannot afford right now  Discussed: To prevent or relieve headaches, try the following: Cool Compress. Lie down and place a cool compress on your head.  Avoid headache triggers. If certain foods or odors seem to have triggered your migraines in the past, avoid them. A headache diary might help you identify triggers.  Include physical activity in your daily routine. Try a daily walk or other moderate aerobic exercise.  Manage stress. Find healthy ways to cope with the stressors, such as delegating tasks on your to-do list.  Practice relaxation techniques. Try deep breathing, yoga, massage and visualization.  Eat regularly. Eating regularly scheduled meals and maintaining a healthy diet might help prevent headaches. Also, drink plenty of fluids.  Follow a regular sleep schedule. Sleep deprivation might contribute to headaches Consider biofeedback. With this mind-body technique, you learn to control certain bodily functions - such as muscle tension, heart rate and blood pressure - to prevent headaches or reduce headache pain.    Proceed to emergency room if you experience new or worsening symptoms or symptoms do not resolve, if you have new neurologic symptoms or if headache is severe, or for any concerning symptom.   Provided education and documentation from American headache Society toolbox including articles on: chronic migraine medication overuse headache, chronic migraines, prevention of migraines, behavioral and other nonpharmacologic treatments for headache.   Samantha DeanAntonia Whitnie Deleon, MD  [facility] [address] [place], [place] [id number]  Phone [phone] Fax [phone]

## 2017-05-01 ENCOUNTER — Telehealth: Payer: Self-pay | Admitting: Neurology

## 2017-05-01 NOTE — Telephone Encounter (Signed)
Called the patient and her husband answered. He said that he was not currently at home and the patient did not have access to a phone. I informed him that we do not have a DPR on file for her so unfortunately I cannot discuss her private information. He verbalized understanding and will call us back when he is with her today or tomorrow.

## 2017-05-01 NOTE — Telephone Encounter (Signed)
Called using [employer] interpreters, Samantha [name] who speaks Urdu, to leave a voicemail asking for a call back. Left office number for return call.

## 2017-05-01 NOTE — Telephone Encounter (Signed)
Pt's husband called she took venlafaxine XR (EFFEXOR XR) 75 MG 24 hr capsule this morning and is now complaining of being weak, cold and sleepy. Please call to advise

## 2017-05-01 NOTE — Telephone Encounter (Signed)
Pt husband has called back he stated he was with his wife(so RN could speak with pt since husband is not on a DPR) RN Toma CopierBethany confirmed she was not available at the time to take his and pt's call.  Pt husband states he will be with pt on tomorrow if RN Toma CopierBethany can give him and pt a call on tomorrow

## 2017-05-02 ENCOUNTER — Telehealth: Payer: Self-pay | Admitting: Diagnostic Neuroimaging

## 2017-05-02 NOTE — Telephone Encounter (Signed)
Pt husband has called back(he was reminded that RN Toma CopierBethany would not likely be able to speak with him) Pt husband did ok the suggestion of RN Bethany reaching out to pt again by using the interpreter line.  Please call pt. Husband states pt still complaining of being weak and cold

## 2017-05-02 NOTE — Telephone Encounter (Signed)
Patient's husband called in regarding new medication Effexor.  Patient took 1 dose on empty stomach and since that time has felt "weak and cold" throughout.  I advised patient to stop medication and wait for side effects to wear off.     Suanne MarkerVIKRAM R. PENUMALLI, MD 05/02/2017, 5:35 PM Certified in Neurology, Neurophysiology and Neuroimaging  [facility] [address], [address] [place], [place] [address] [phone]

## 2017-05-02 NOTE — Telephone Encounter (Addendum)
Called husband back (he is on DPR which was uploaded this afternoon) and LVM asking for call back. Left office number and hours on the message.

## 2017-05-15 NOTE — L&D Delivery Note (Signed)
Delivery Note Pt presented with SROM and latent labor. Without augmentation, she became complete at 0200 and labored down as she wasn't feeling pressure. At 6:48 AM a viable female was delivered via VBAC, Spontaneous (Presentation: OA).  APGAR: 9, 9; weight 7 lb 2.3 oz (3240 g). Infant dried and placed on pt's abd; cord clamped and cut by FOB; hospital cord blood sample collected. Placenta status: spont , intact .  Cord: 3 vessel  Anesthesia:  Epidural Episiotomy: None Lacerations: None- sm abrasion at base of introitus; hemostatic Est. Blood Loss (mL): 50  Mom to postpartum.  Baby to Couplet care / Skin to Skin.  Samantha [name] CNM 03/30/2018, 10:32 AM  Please schedule this patient for Postpartum visit in: 4 weeks with the following provider: Any provider For C/S patients schedule nurse incision check in weeks 2 weeks: no High risk pregnancy complicated by: VBAC Delivery mode:  SVD Anticipated Birth Control:  Condoms vs Paragard PP Procedures needed: none  Schedule Integrated BH visit: no

## 2017-06-22 ENCOUNTER — Ambulatory Visit: Payer: No Typology Code available for payment source | Admitting: Interventional Cardiology

## 2017-06-25 NOTE — Progress Notes (Deleted)
Cardiology Office Note    Date:  06/25/2017   ID:  Samantha BurkeSehrish Chuck, DOB 09-29-1985, MRN [id number]  PCP:  Bing NeighborsHarris, Kimberly S, FNP  Cardiologist: Lesleigh NoeHenry W Derionna Salvador III, MD   No chief complaint on file.   History of Present Illness:  Samantha [name] is a 32 y.o. female referred for evaluation of chest pain by Godfrey PickKimberly S. Tiburcio PeaHarris, FNP.    Past Medical History:  Diagnosis Date  . Medical history non-contributory   . Migraine     Past Surgical History:  Procedure Laterality Date  . CESAREAN SECTION      Current Medications: Outpatient Medications Prior to Visit  Medication Sig Dispense Refill  . aspirin-acetaminophen-caffeine (EXCEDRIN MIGRAINE) 250-250-65 MG tablet Take by mouth every 6 (six) hours as needed for headache.    . ranitidine (ZANTAC) 150 MG tablet Take 1 tablet (150 mg total) by mouth 2 (two) times daily. 60 tablet 2  . venlafaxine XR (EFFEXOR XR) 75 MG 24 hr capsule Take 1 capsule (75 mg total) by mouth daily with breakfast. 30 capsule 6   No facility-administered medications prior to visit.      Allergies:   Patient has no known allergies.   Social History   Socioeconomic History  . Marital status: Married    Spouse name: Not on file  . Number of children: Not on file  . Years of education: Not on file  . Highest education level: Not on file  Social Needs  . Financial resource strain: Not on file  . Food insecurity - worry: Not on file  . Food insecurity - inability: Not on file  . Transportation needs - medical: Not on file  . Transportation needs - non-medical: Not on file  Occupational History  . Not on file  Tobacco Use  . Smoking status: Never Smoker  . Smokeless tobacco: Never Used  Substance and Sexual Activity  . Alcohol use: No  . Drug use: No  . Sexual activity: Yes    Birth control/protection: None  Other Topics Concern  . Not on file  Social History Narrative  . Not on file     Family History:  The patient's ***family history  includes Asthma in her father; Migraines in her mother.   ROS:   Please see the history of present illness.    ***  All other systems reviewed and are negative.   PHYSICAL EXAM:   VS:  There were no vitals taken for this visit.   GEN: Well nourished, well developed, in no acute distress  HEENT: normal  Neck: no JVD, carotid bruits, or masses Cardiac: ***RRR; no murmurs, rubs, or gallops,no edema  Respiratory:  clear to auscultation bilaterally, normal work of breathing GI: soft, nontender, nondistended, + BS MS: no deformity or atrophy  Skin: warm and dry, no rash Neuro:  Alert and Oriented x 3, Strength and sensation are intact Psych: euthymic mood, full affect  Wt Readings from Last 3 Encounters:  04/30/17 156 lb (70.8 kg)  03/05/17 151 lb 12.8 oz (68.9 kg)  02/05/17 149 lb (67.6 kg)      Studies/Labs Reviewed:   EKG:  EKG  ***  Recent Labs: 11/30/2016: ALT 13; BUN 12; Creat 0.70; Hemoglobin 13.6; Platelets 259; Potassium 4.4; Sodium 139; TSH 1.12   Lipid Panel No results found for: CHOL, TRIG, HDL, CHOLHDL, VLDL, LDLCALC, LDLDIRECT  Additional studies/ records that were reviewed today include:  ***    ASSESSMENT:    No diagnosis found.  PLAN:  In order of problems listed above:  1. ***    Medication Adjustments/Labs and Tests Ordered: Current medicines are reviewed at length with the patient today.  Concerns regarding medicines are outlined above.  Medication changes, Labs and Tests ordered today are listed in the Patient Instructions below. There are no Patient Instructions on file for this visit.   Signed, Lesleigh Noe, MD  06/25/2017 6:52 PM    [facility] [address] [place], [place], [place]  [address] Phone: [phone]; Fax: [phone]

## 2017-06-26 ENCOUNTER — Ambulatory Visit: Payer: Self-pay | Admitting: Interventional Cardiology

## 2017-07-05 ENCOUNTER — Encounter: Payer: Self-pay | Admitting: Interventional Cardiology

## 2017-08-01 ENCOUNTER — Encounter: Payer: Self-pay | Admitting: General Practice

## 2017-08-01 ENCOUNTER — Ambulatory Visit (INDEPENDENT_AMBULATORY_CARE_PROVIDER_SITE_OTHER): Payer: Self-pay

## 2017-08-01 DIAGNOSIS — Z3201 Encounter for pregnancy test, result positive: Secondary | ICD-10-CM

## 2017-08-01 LAB — POCT PREGNANCY, URINE: Preg Test, Ur: POSITIVE — AB

## 2017-08-01 NOTE — Progress Notes (Signed)
Pt came for UPT-positive, took 1 pregnancy test at home & it was positive also, has already started Prenatal Vitamins.Reviewed other meds, only taking Zantact for heartburn.LMP: 06/15/16, Approx., EDD: 03/22/18, GA: [id number].Pt verbally acknowledged.Pt stated needed letter to show proof of pregnancy directed to front desk.

## 2017-08-20 ENCOUNTER — Telehealth: Payer: Self-pay | Admitting: General Practice

## 2017-08-20 NOTE — Telephone Encounter (Signed)
Patient's husband called and left message on nurse line stating his wife is having vomiting & body aches and requests a call back. Called patient with Urdu interpreter [phone], no answer- left message stating we are trying to reach you to return your phone call, please call us back if you still need assistance

## 2017-08-21 ENCOUNTER — Encounter (HOSPITAL_COMMUNITY): Payer: Self-pay | Admitting: *Deleted

## 2017-08-21 ENCOUNTER — Other Ambulatory Visit: Payer: Self-pay

## 2017-08-21 ENCOUNTER — Inpatient Hospital Stay (HOSPITAL_COMMUNITY)
Admission: AD | Admit: 2017-08-21 | Discharge: 2017-08-21 | Disposition: A | Discharge status: Home / Self Care | Payer: Medicaid Other | Source: Ambulatory Visit | Attending: Family Medicine | Admitting: Family Medicine

## 2017-08-21 DIAGNOSIS — O210 Mild hyperemesis gravidarum: Secondary | ICD-10-CM

## 2017-08-21 DIAGNOSIS — O219 Vomiting of pregnancy, unspecified: Secondary | ICD-10-CM

## 2017-08-21 DIAGNOSIS — O26891 Other specified pregnancy related conditions, first trimester: Secondary | ICD-10-CM | POA: Diagnosis present

## 2017-08-21 DIAGNOSIS — O211 Hyperemesis gravidarum with metabolic disturbance: Secondary | ICD-10-CM | POA: Insufficient documentation

## 2017-08-21 DIAGNOSIS — Z3A08 8 weeks gestation of pregnancy: Secondary | ICD-10-CM | POA: Insufficient documentation

## 2017-08-21 DIAGNOSIS — E860 Dehydration: Secondary | ICD-10-CM

## 2017-08-21 LAB — COMPREHENSIVE METABOLIC PANEL
ALK PHOS: 35 U/L — AB (ref 38–126)
ALT: 16 U/L (ref 14–54)
AST: 17 U/L (ref 15–41)
Albumin: 3.9 g/dL (ref 3.5–5.0)
Anion gap: 8 (ref 5–15)
BILIRUBIN TOTAL: 0.4 mg/dL (ref 0.3–1.2)
BUN: 13 mg/dL (ref 6–20)
CALCIUM: 8.8 mg/dL — AB (ref 8.9–10.3)
CHLORIDE: 107 mmol/L (ref 101–111)
CO2: 20 mmol/L — ABNORMAL LOW (ref 22–32)
CREATININE: 0.47 mg/dL (ref 0.44–1.00)
GFR calc Af Amer: >60 mL/min (ref >60)
Glucose, Bld: 127 mg/dL — ABNORMAL HIGH (ref 65–99)
Potassium: 3.7 mmol/L (ref 3.5–5.1)
Sodium: 135 mmol/L (ref 135–145)
Total Protein: 7.3 g/dL (ref 6.5–8.1)

## 2017-08-21 LAB — URINALYSIS, ROUTINE W REFLEX MICROSCOPIC
BILIRUBIN URINE: NEGATIVE
GLUCOSE, UA: NEGATIVE mg/dL
HGB URINE DIPSTICK: NEGATIVE
Ketones, ur: 5 mg/dL — AB
NITRITE: NEGATIVE
PROTEIN: 30 mg/dL — AB
SPECIFIC GRAVITY, URINE: 1.033 — AB (ref 1.005–1.030)
pH: 5 (ref 5.0–8.0)

## 2017-08-21 LAB — CBC
HEMATOCRIT: 36.9 % (ref 36.0–46.0)
HEMOGLOBIN: 12.8 g/dL (ref 12.0–15.0)
MCH: 30 pg (ref 26.0–34.0)
MCHC: 34.7 g/dL (ref 30.0–36.0)
MCV: 86.6 fL (ref 78.0–100.0)
PLATELETS: 216 10*3/uL (ref 150–400)
RBC: 4.26 MIL/uL (ref 3.87–5.11)
RDW: 12.6 % (ref 11.5–15.5)
WBC: 7.7 10*3/uL (ref 4.0–10.5)

## 2017-08-21 LAB — POCT PREGNANCY, URINE: Preg Test, Ur: POSITIVE — AB

## 2017-08-21 MED ORDER — LACTATED RINGERS IV BOLUS
1000.0000 mL | Freq: Once | INTRAVENOUS | Status: AC
  Administered 2017-08-21: 1000 mL via INTRAVENOUS

## 2017-08-21 MED ORDER — PROMETHAZINE HCL 25 MG/ML IJ SOLN
25.0000 mg | Freq: Once | INTRAMUSCULAR | Status: AC
  Administered 2017-08-21: 25 mg via INTRAVENOUS
  Filled 2017-08-21: qty 1

## 2017-08-21 MED ORDER — FAMOTIDINE IN NACL 20-0.9 MG/50ML-% IV SOLN
20.0000 mg | Freq: Once | INTRAVENOUS | Status: AC
  Administered 2017-08-21: 20 mg via INTRAVENOUS
  Filled 2017-08-21: qty 50

## 2017-08-21 MED ORDER — ONDANSETRON 4 MG PO TBDP: 4.0000 mg | ORAL_TABLET | Freq: Three times a day (TID) | ORAL | 0 refills | Status: DC | PRN

## 2017-08-21 NOTE — MAU Provider Note (Signed)
History     CSN: [id number]  Arrival date and time: 08/21/17 1814  Chief Complaint  Patient presents with  . no appetite  . Generalized Body Aches  . Emesis  . Diarrhea   G3P2002 @8 .4 wks here with N/V, body aches, and weakness. Sx started about 1 mos ago. She has vomiting most days. She is taking little po. Feels weak. No VB or abd pain.   OB History    Gravida  3   Para  2   Term  2   Preterm      AB      Living  1     SAB      TAB      Ectopic      Multiple  0   Live Births  1           Past Medical History:  Diagnosis Date  . Medical history non-contributory   . Migraine     Past Surgical History:  Procedure Laterality Date  . CESAREAN SECTION      Family History  Problem Relation Age of Onset  . Asthma Father   . Migraines Mother     Social History   Tobacco Use  . Smoking status: Never Smoker  . Smokeless tobacco: Never Used  Substance Use Topics  . Alcohol use: No  . Drug use: No    Allergies: No Known Allergies  Medications Prior to Admission  Medication Sig Dispense Refill Last Dose  . acetaminophen (TYLENOL) 650 MG CR tablet Take 650 mg by mouth every 8 (eight) hours as needed for pain.     . prenatal vitamin w/FE, FA (PRENATAL 1 + 1) 27-1 MG TABS tablet Take 1 tablet by mouth daily at 12 noon.     . ranitidine (ZANTAC) 150 MG tablet Take 1 tablet (150 mg total) by mouth 2 (two) times daily. 60 tablet 2 Taking    Review of Systems  Constitutional: Positive for appetite change. Negative for chills and fever.  Gastrointestinal: Positive for nausea and vomiting. Negative for abdominal pain, constipation and diarrhea.  Genitourinary: Negative for vaginal bleeding.  Neurological: Positive for weakness.   Physical Exam   Blood pressure 118/68, pulse 89, temperature 98.8 F (37.1 C), temperature source Oral, resp. rate 16, weight 149 lb (67.6 kg), last menstrual period 06/22/2017, SpO2 100 %, currently  breastfeeding.  Physical Exam  Nursing note and vitals reviewed. Constitutional: She is oriented to person, place, and time. She appears well-developed and well-nourished. No distress.  HENT:  Head: Normocephalic and atraumatic.  Neck: Normal range of motion.  Cardiovascular: Normal rate.  Respiratory: Effort normal. No respiratory distress.  GI: Soft. She exhibits no distension and no mass. There is no tenderness. There is no rebound and no guarding.  Musculoskeletal: Normal range of motion.  Neurological: She is alert and oriented to person, place, and time.  Skin: Skin is warm and dry.  Psychiatric: She has a normal mood and affect.   Results for orders placed or performed during the hospital encounter of 08/21/17 (from the past 24 hour(s))  Urinalysis, Routine w reflex microscopic     Status: Abnormal   Collection Time: 08/21/17  6:51 PM  Result Value Ref Range   Color, Urine AMBER (A) YELLOW   APPearance CLOUDY (A) CLEAR   Specific Gravity, Urine 1.033 (H) 1.005 - 1.030   pH 5.0 5.0 - 8.0   Glucose, UA NEGATIVE NEGATIVE mg/dL   Hgb urine dipstick NEGATIVE NEGATIVE  Bilirubin Urine NEGATIVE NEGATIVE   Ketones, ur 5 (A) NEGATIVE mg/dL   Protein, ur 30 (A) NEGATIVE mg/dL   Nitrite NEGATIVE NEGATIVE   Leukocytes, UA SMALL (A) NEGATIVE   RBC / HPF 0-5 0 - 5 RBC/hpf   WBC, UA 0-5 0 - 5 WBC/hpf   Bacteria, UA FEW (A) NONE SEEN   Squamous Epithelial / LPF 0-5 (A) NONE SEEN   Mucus PRESENT    Ca Oxalate Crys, UA PRESENT   Pregnancy, urine POC     Status: Abnormal   Collection Time: 08/21/17  7:01 PM  Result Value Ref Range   Preg Test, Ur POSITIVE (A) NEGATIVE   MAU Course  Procedures LR Phenergan Pepcid  MDM Labs ordered and reviewed. Pt tolerating po after IVF and meds. Did not like how Phenergan made her drowsy and legs restless. Will Rx Zofran, warned about constipation. Stable for discharge home.  Assessment and Plan   1. [redacted] weeks gestation of pregnancy   2.  Morning sickness   3. Dehydration    Discharge home Follow up in [facility] as scheduled Rx Zofran Maintain hydration  Allergies as of 08/21/2017   No Known Allergies     Medication List    TAKE these medications   acetaminophen 650 MG CR tablet Commonly known as:  TYLENOL Take 650 mg by mouth every 8 (eight) hours as needed for pain.   ondansetron 4 MG disintegrating tablet Commonly known as:  ZOFRAN ODT Take 1 tablet (4 mg total) by mouth every 8 (eight) hours as needed for nausea or vomiting.   prenatal vitamin w/FE, FA 27-1 MG Tabs tablet Take 1 tablet by mouth daily at 12 noon.   ranitidine 150 MG tablet Commonly known as:  ZANTAC Take 1 tablet (150 mg total) by mouth 2 (two) times daily.      [employer] interpreter used for encounter  Donette LarryMelanie Jayla Mackie, CNM 08/21/2017, 7:55 PM

## 2017-08-21 NOTE — Discharge Instructions (Signed)
Eating Plan for Hyperemesis Gravidarum °Hyperemesis gravidarum is a severe form of morning sickness. Because this condition causes severe nausea and vomiting, it can lead to dehydration, malnutrition, and weight loss. One way to lessen the symptoms of nausea and vomiting is to follow the eating plan for hyperemesis gravidarum. It is often used along with prescribed medicines to control your symptoms. °What can I do to relieve my symptoms? °Listen to your body. Everyone is different and has different preferences. Find what works best for you. Take any of the following actions that are helpful to you: °· Eat and drink slowly. °· Eat 5-6 small meals daily instead of 3 large meals. °· Eat crackers before you get out of bed in the morning. °· Try having a snack in the middle of the night. °· Starchy foods are usually tolerated well. Examples include cereal, toast, bread, potatoes, pasta, rice, and pretzels. °· Ginger may help with nausea. Add ¼ tsp ground ginger to hot tea or choose ginger tea. °· Try drinking 100% fruit juice or an electrolyte drink. An electrolyte drink contains sodium, potassium, and chloride. °· Continue to take your prenatal vitamins as told by your health care provider. If you are having trouble taking your prenatal vitamins, talk with your health care provider about different options. °· Include at least 1 serving of protein with your meals and snacks. Protein options include meats or poultry, beans, nuts, eggs, and yogurt. Try eating a protein-rich snack before bed. Examples of these snacks include cheese and crackers or half of a peanut butter or turkey sandwich. °· Consider eliminating foods that trigger your symptoms. These may include spicy foods, coffee, high-fat foods, very sweet foods, and acidic foods. °· Try meals that have more protein combined with bland, salty, lower-fat, and dry foods, such as nuts, seeds, pretzels, crackers, and cereal. °· Talk with your healthcare provider about  starting a supplement of vitamin B6. °· Have fluids that are cold, clear, and carbonated or sour. Examples include lemonade, ginger ale, lemon-lime soda, ice water, and sparkling water. °· Try lemon or mint tea. °· Try brushing your teeth or using a mouth rinse after meals. ° °What should I avoid to reduce my symptoms? °Avoiding some of the following things may help reduce your symptoms. °· Foods with strong smells. Try eating meals in well-ventilated areas that are free of odors. °· Drinking water or other beverages with meals. Try not to drink anything during the 30 minutes before and after your meals. °· Drinking more than 1 cup of fluid at a time. Sometimes using a straw helps. °· Fried or high-fat foods, such as butter and cream sauces. °· Spicy foods. °· Skipping meals as best as you can. Nausea can be more intense on an empty stomach. If you cannot tolerate food at that time, do not force it. Try sucking on ice chips or other frozen items, and make up for missed calories later. °· Lying down within 2 hours after eating. °· Environmental triggers. These may include smoky rooms, closed spaces, rooms with strong smells, warm or humid places, overly loud and noisy rooms, and rooms with motion or flickering lights. °· Quick and sudden changes in your movement. ° °This information is not intended to replace advice given to you by your health care provider. Make sure you discuss any questions you have with your health care provider. °Document Released: 02/26/2007 Document Revised: 12/29/2015 Document Reviewed: 11/30/2015 °Elsevier Interactive Patient Education © 2018 [employer] °Morning Sickness °Morning sickness   is when you feel sick to your stomach (nauseous) during pregnancy. You may feel sick to your stomach and throw up (vomit). You may feel sick in the morning, but you can feel this way any time of day. Some women feel very sick to their stomach and cannot stop throwing up (hyperemesis gravidarum). °Follow  these instructions at home: °· Only take medicines as told by your doctor. °· Take multivitamins as told by your doctor. Taking multivitamins before getting pregnant can stop or lessen the harshness of morning sickness. °· Eat dry toast or unsalted crackers before getting out of bed. °· Eat 5 to 6 small meals a day. °· Eat dry and bland foods like rice and baked potatoes. °· Do not drink liquids with meals. Drink between meals. °· Do not eat greasy, fatty, or spicy foods. °· Have someone cook for you if the smell of food causes you to feel sick or throw up. °· If you feel sick to your stomach after taking prenatal vitamins, take them at night or with a snack. °· Eat protein when you need a snack (nuts, yogurt, cheese). °· Eat unsweetened gelatins for dessert. °· Wear a bracelet used for sea sickness (acupressure wristband). °· Go to a doctor that puts thin needles into certain body points (acupuncture) to improve how you feel. °· Do not smoke. °· Use a humidifier to keep the air in your house free of odors. °· Get lots of fresh air. °Contact a doctor if: °· You need medicine to feel better. °· You feel dizzy or lightheaded. °· You are losing weight. °Get help right away if: °· You feel very sick to your stomach and cannot stop throwing up. °· You pass out (faint). °This information is not intended to replace advice given to you by your health care provider. Make sure you discuss any questions you have with your health care provider. °Document Released: 06/08/2004 Document Revised: 10/07/2015 Document Reviewed: 10/16/2012 °Elsevier Interactive Patient Education © 2017 [employer] ° °

## 2017-08-21 NOTE — MAU Note (Signed)
When she eats, it hurts in her throat, hurts to swallow. Some vomiting. Doesn't feel hungry, is 2 months preg. Having body pain, hurts all over. No fever. ? Diarrhea- ? Watery stools 2/3 x a day, on going problem.

## 2017-08-27 ENCOUNTER — Ambulatory Visit: Payer: No Typology Code available for payment source | Admitting: Adult Health

## 2017-08-29 ENCOUNTER — Telehealth: Payer: Self-pay

## 2017-09-03 ENCOUNTER — Ambulatory Visit: Payer: Self-pay | Admitting: Family Medicine

## 2017-09-10 ENCOUNTER — Ambulatory Visit (INDEPENDENT_AMBULATORY_CARE_PROVIDER_SITE_OTHER): Payer: Medicaid Other | Admitting: Advanced Practice Midwife

## 2017-09-10 ENCOUNTER — Encounter: Payer: Self-pay | Admitting: Advanced Practice Midwife

## 2017-09-10 ENCOUNTER — Other Ambulatory Visit (HOSPITAL_COMMUNITY)
Admission: RE | Admit: 2017-09-10 | Discharge: 2017-09-10 | Disposition: A | Discharge status: Home / Self Care | Payer: Medicaid Other | Source: Ambulatory Visit | Attending: Advanced Practice Midwife | Admitting: Advanced Practice Midwife

## 2017-09-10 DIAGNOSIS — Z3480 Encounter for supervision of other normal pregnancy, unspecified trimester: Secondary | ICD-10-CM | POA: Diagnosis not present

## 2017-09-10 DIAGNOSIS — Z3A00 Weeks of gestation of pregnancy not specified: Secondary | ICD-10-CM | POA: Insufficient documentation

## 2017-09-10 DIAGNOSIS — Z3481 Encounter for supervision of other normal pregnancy, first trimester: Secondary | ICD-10-CM

## 2017-09-10 DIAGNOSIS — O34219 Maternal care for unspecified type scar from previous cesarean delivery: Secondary | ICD-10-CM

## 2017-09-10 MED ORDER — ONDANSETRON 8 MG PO TBDP: 8.0000 mg | ORAL_TABLET | Freq: Three times a day (TID) | ORAL | 2 refills | Status: DC | PRN

## 2017-09-10 MED ORDER — FAMOTIDINE 10 MG PO CHEW: 20.0000 mg | CHEWABLE_TABLET | Freq: Two times a day (BID) | ORAL | 3 refills | Status: DC | PRN

## 2017-09-10 MED ORDER — BUTALBITAL-APAP-CAFF-COD 50-325-40-30 MG PO CAPS: 1.0000 | ORAL_CAPSULE | Freq: Four times a day (QID) | ORAL | 0 refills | Status: DC | PRN

## 2017-09-10 MED ORDER — PRENATAL VITAMINS 0.8 MG PO TABS: 1.0000 | ORAL_TABLET | Freq: Every day | ORAL | 12 refills | Status: DC

## 2017-09-10 NOTE — Progress Notes (Signed)
  Subjective:    Samantha [name] is being seen today for her first obstetrical visit.  This is a planned pregnancy. She is at [redacted]w[redacted]d gestation. Her obstetrical history is significant for previous c-section x 1. Relationship with FOB: spouse, living together. Patient does intend to breast feed, breast fed other children. Pregnancy history fully reviewed.  Patient reports backache, headache, heartburn and nausea.  Review of Systems:   Review of Systems  All other systems reviewed and are negative.   Objective:     BP 115/72   Pulse 94   Wt 144 lb 4.8 oz (65.5 kg)   LMP 06/22/2017   BMI 27.27 kg/m  Physical Exam  Vitals reviewed. Constitutional: She is oriented to person, place, and time. She appears well-developed and well-nourished. No distress.  HENT:  Head: Normocephalic.  Cardiovascular: Normal rate.  Respiratory: Effort normal. Right breast exhibits no inverted nipple, no mass and no nipple discharge. Left breast exhibits no inverted nipple, no mass and no nipple discharge.  GI: Soft. There is no tenderness. There is no rebound.  Genitourinary:  Genitourinary Comments:  External: no lesion Vagina: small amount of white discharge Cervix: pink, smooth, no CMT Uterus: AGA  Neurological: She is alert and oriented to person, place, and time.  Skin: Skin is warm and dry.  Psychiatric: She has a normal mood and affect.    Maternal Exam:  Abdomen: Fundal height is 11.       Fetal Exam Fetal Monitor Review: Mode: hand-held doppler probe.   Baseline rate: 151.         Assessment:    Pregnancy: [id number] Patient Active Problem List   Diagnosis Date Noted  . Supervision of other normal pregnancy, antepartum 09/10/2017  . Migraine without aura and without status migrainosus, not intractable 04/30/2017  . Previous cesarean delivery, antepartum 07/30/2014       Plan:     Initial labs drawn. Previous c-section x 1, successful VBAC, desires TOLAC again Prenatal  vitamins. Problem list reviewed and updated. Panorama discussed: requested. Role of ultrasound in pregnancy discussed; fetal survey: requested. Amniocentesis discussed: not indicated. Follow up in 4 weeks. 50% of 45 min visit spent on counseling and coordination of care.     Thressa Sheller 09/10/2017

## 2017-09-10 NOTE — Patient Instructions (Signed)

## 2017-09-12 ENCOUNTER — Encounter: Payer: Self-pay | Admitting: Obstetrics and Gynecology

## 2017-09-12 LAB — CYTOLOGY - PAP
CHLAMYDIA, DNA PROBE: NEGATIVE
Diagnosis: NEGATIVE
HPV: NOT DETECTED
Neisseria Gonorrhea: NEGATIVE

## 2017-09-13 LAB — CULTURE, OB URINE

## 2017-09-13 LAB — URINE CULTURE, OB REFLEX

## 2017-09-15 ENCOUNTER — Encounter (HOSPITAL_COMMUNITY): Payer: Self-pay

## 2017-09-15 ENCOUNTER — Other Ambulatory Visit: Payer: Self-pay

## 2017-09-15 ENCOUNTER — Inpatient Hospital Stay (HOSPITAL_COMMUNITY)
Admission: AD | Admit: 2017-09-15 | Discharge: 2017-09-15 | Disposition: A | Discharge status: Home / Self Care | Payer: Medicaid Other | Source: Ambulatory Visit | Attending: Obstetrics and Gynecology | Admitting: Obstetrics and Gynecology

## 2017-09-15 DIAGNOSIS — J3489 Other specified disorders of nose and nasal sinuses: Secondary | ICD-10-CM | POA: Diagnosis not present

## 2017-09-15 DIAGNOSIS — O9989 Other specified diseases and conditions complicating pregnancy, childbirth and the puerperium: Secondary | ICD-10-CM | POA: Diagnosis not present

## 2017-09-15 DIAGNOSIS — Z3A12 12 weeks gestation of pregnancy: Secondary | ICD-10-CM | POA: Diagnosis not present

## 2017-09-15 DIAGNOSIS — G441 Vascular headache, not elsewhere classified: Secondary | ICD-10-CM | POA: Diagnosis not present

## 2017-09-15 DIAGNOSIS — J029 Acute pharyngitis, unspecified: Secondary | ICD-10-CM

## 2017-09-15 DIAGNOSIS — R52 Pain, unspecified: Secondary | ICD-10-CM

## 2017-09-15 DIAGNOSIS — R509 Fever, unspecified: Secondary | ICD-10-CM | POA: Diagnosis not present

## 2017-09-15 DIAGNOSIS — O26891 Other specified pregnancy related conditions, first trimester: Secondary | ICD-10-CM | POA: Insufficient documentation

## 2017-09-15 LAB — CBC WITH DIFFERENTIAL/PLATELET
BASOS ABS: 0 10*3/uL (ref 0.0–0.1)
BASOS PCT: 0 %
EOS PCT: 0 %
Eosinophils Absolute: 0 10*3/uL (ref 0.0–0.7)
HEMATOCRIT: 35.3 % — AB (ref 36.0–46.0)
Hemoglobin: 12.4 g/dL (ref 12.0–15.0)
LYMPHS PCT: 20 %
Lymphs Abs: 1.2 10*3/uL (ref 0.7–4.0)
MCH: 30.6 pg (ref 26.0–34.0)
MCHC: 35.1 g/dL (ref 30.0–36.0)
MCV: 87.2 fL (ref 78.0–100.0)
Monocytes Absolute: 0.3 10*3/uL (ref 0.1–1.0)
Monocytes Relative: 5 %
NEUTROS ABS: 4.4 10*3/uL (ref 1.7–7.7)
Neutrophils Relative %: 75 %
PLATELETS: 183 10*3/uL (ref 150–400)
RBC: 4.05 MIL/uL (ref 3.87–5.11)
RDW: 12.5 % (ref 11.5–15.5)
WBC: 5.9 10*3/uL (ref 4.0–10.5)

## 2017-09-15 LAB — INFLUENZA PANEL BY PCR (TYPE A & B)
INFLBPCR: NEGATIVE
Influenza A By PCR: NEGATIVE

## 2017-09-15 LAB — URINALYSIS, ROUTINE W REFLEX MICROSCOPIC
BACTERIA UA: NONE SEEN
Bilirubin Urine: NEGATIVE
Glucose, UA: NEGATIVE mg/dL
KETONES UR: 80 mg/dL — AB
Leukocytes, UA: NEGATIVE
Nitrite: NEGATIVE
PROTEIN: NEGATIVE mg/dL
Specific Gravity, Urine: 1.012 (ref 1.005–1.030)
pH: 8 (ref 5.0–8.0)

## 2017-09-15 LAB — RAPID STREP SCREEN (MED CTR MEBANE ONLY): Streptococcus, Group A Screen (Direct): NEGATIVE

## 2017-09-15 MED ORDER — BUTALBITAL-APAP-CAFF-COD 50-325-40-30 MG PO CAPS: 1.0000 | ORAL_CAPSULE | Freq: Four times a day (QID) | ORAL | 0 refills | Status: DC | PRN

## 2017-09-15 MED ORDER — LACTATED RINGERS IV BOLUS
2000.0000 mL | Freq: Once | INTRAVENOUS | Status: AC
  Administered 2017-09-15: 2000 mL via INTRAVENOUS

## 2017-09-15 MED ORDER — BUTALBITAL-APAP-CAFFEINE 50-325-40 MG PO TABS
2.0000 | ORAL_TABLET | Freq: Once | ORAL | Status: AC
  Administered 2017-09-15: 2 via ORAL
  Filled 2017-09-15: qty 2

## 2017-09-15 NOTE — Discharge Instructions (Signed)

## 2017-09-15 NOTE — Progress Notes (Addendum)
G3P2 @ [redacted] wksga  [phone] Pashto interpreter   Ha with generalized pan 10/10. Took tylenol around 09-10  Provider at [facility] assessing  Rapid strip and flu swab done Lab at [facility]   1744: medicated per order  1814 IV placed LR bolus infusing per order   2000: d/c instructions given with use of stratus. pt verbalizes understanding. Left unit via ambulatory with family.

## 2017-09-15 NOTE — MAU Provider Note (Signed)
History   G3P2002 @ 12.1 wks in c/o stuffy nose, low grade fever, body aches, sore throat that has been going on several days. Denies vag bleeding.  CSN: [id number]  Arrival date & time 09/15/17  1647   None     Chief Complaint  Patient presents with  . Fever    HPI  Past Medical History:  Diagnosis Date  . Migraine     Past Surgical History:  Procedure Laterality Date  . CESAREAN SECTION      Family History  Problem Relation Age of Onset  . Asthma Father   . Migraines Mother     Social History   Tobacco Use  . Smoking status: Never Smoker  . Smokeless tobacco: Never Used  Substance Use Topics  . Alcohol use: No  . Drug use: No    OB History    Gravida  3   Para  2   Term  2   Preterm      AB      Living  2     SAB      TAB      Ectopic      Multiple  0   Live Births  2           Review of Systems  Constitutional: Negative.   HENT: Positive for postnasal drip, rhinorrhea, sinus pain and sore throat.   Eyes: Negative.   Respiratory: Negative.   Cardiovascular: Negative.   Endocrine: Negative.   Genitourinary: Negative.   Musculoskeletal: Negative.   Skin: Negative.   Allergic/Immunologic: Negative.   Neurological: Negative.   Hematological: Negative.   Psychiatric/Behavioral: Negative.     Allergies  Patient has no known allergies.  Home Medications    LMP 06/22/2017   Physical Exam  Constitutional: She is oriented to person, place, and time. She appears well-developed and well-nourished.  HENT:  Head: Normocephalic.  Nose: Nose normal.  Back of throat sl red not blisters or exuadate.  Neck: Normal range of motion.  Cardiovascular: Normal rate, regular rhythm, normal heart sounds and intact distal pulses.  Pulmonary/Chest: Effort normal and breath sounds normal.  Abdominal: Soft. Bowel sounds are normal.  Musculoskeletal: Normal range of motion.  Neurological: She is alert and oriented to person, place, and time.   Skin: Skin is warm and dry.  Psychiatric: She has a normal mood and affect. Her behavior is normal. Judgment and thought content normal.    MAU Course  Procedures (including critical care time)  Labs Reviewed  RAPID STREP SCREEN ([facility] & [facility] ONLY)  URINALYSIS, ROUTINE W REFLEX MICROSCOPIC  CBC WITH DIFFERENTIAL/PLATELET  INFLUENZA PANEL BY PCR (TYPE A & B)   No results found.   1. Fever and chills   2. Generalized body aches   3. Other vascular headache   4. Stuffy and runny nose       MDM  VSS, Heart RRR, LCTAB. Throat sl red at back, no blisters or exudate. Labs normal. Throat culture pending. Pt states feels much better will d/c home

## 2017-09-17 LAB — OBSTETRIC PANEL, INCLUDING HIV
ANTIBODY SCREEN: NEGATIVE
Basophils Absolute: 0 10*3/uL (ref 0.0–0.2)
Basos: 0 %
EOS (ABSOLUTE): 0 10*3/uL (ref 0.0–0.4)
EOS: 0 %
HEMATOCRIT: 40 % (ref 34.0–46.6)
HEMOGLOBIN: 13.2 g/dL (ref 11.1–15.9)
HIV Screen 4th Generation wRfx: NONREACTIVE
Hepatitis B Surface Ag: NEGATIVE
IMMATURE GRANS (ABS): 0 10*3/uL (ref 0.0–0.1)
IMMATURE GRANULOCYTES: 0 %
LYMPHS ABS: 1.6 10*3/uL (ref 0.7–3.1)
LYMPHS: 20 %
MCH: 29.4 pg (ref 26.6–33.0)
MCHC: 33 g/dL (ref 31.5–35.7)
MCV: 89 fL (ref 79–97)
MONOS ABS: 0.6 10*3/uL (ref 0.1–0.9)
Monocytes: 7 %
NEUTROS PCT: 73 %
Neutrophils Absolute: 5.8 10*3/uL (ref 1.4–7.0)
Platelets: 263 10*3/uL (ref 150–379)
RBC: 4.49 x10E6/uL (ref 3.77–5.28)
RDW: 13.1 % (ref 12.3–15.4)
RH TYPE: POSITIVE
RPR Ser Ql: NONREACTIVE
Rubella Antibodies, IGG: 3.88 index (ref >0.99)
WBC: 8 10*3/uL (ref 3.4–10.8)

## 2017-09-17 LAB — SMN1 COPY NUMBER ANALYSIS (SMA CARRIER SCREENING)

## 2017-09-17 LAB — CYSTIC FIBROSIS GENE TEST

## 2017-09-18 LAB — CULTURE, GROUP A STREP (THRC)

## 2017-09-19 ENCOUNTER — Encounter: Payer: Self-pay | Admitting: *Deleted

## 2017-10-09 ENCOUNTER — Encounter: Payer: Self-pay | Admitting: *Deleted

## 2017-10-15 ENCOUNTER — Ambulatory Visit (INDEPENDENT_AMBULATORY_CARE_PROVIDER_SITE_OTHER): Payer: Medicaid Other | Admitting: Advanced Practice Midwife

## 2017-10-15 ENCOUNTER — Encounter: Payer: Self-pay | Admitting: Advanced Practice Midwife

## 2017-10-15 VITALS — BP 96/64 | HR 85 | Wt 145.4 lb

## 2017-10-15 DIAGNOSIS — Z98891 History of uterine scar from previous surgery: Secondary | ICD-10-CM

## 2017-10-15 DIAGNOSIS — Z3480 Encounter for supervision of other normal pregnancy, unspecified trimester: Secondary | ICD-10-CM

## 2017-10-15 NOTE — Progress Notes (Signed)
C/o allergy symptoms such as stuff nose , ears feel stuffy, eyes irritated.

## 2017-10-15 NOTE — Patient Instructions (Signed)

## 2017-10-15 NOTE — Progress Notes (Addendum)
   PRENATAL VISIT NOTE  Subjective:  Samantha BurkeSehrish [name] is a 32 y.o. G3P2002 at 3734w3d being seen today for ongoing prenatal care.  She is currently monitored for the following issues for this low-risk pregnancy and has History of VBAC; Migraine without aura and without status migrainosus, not intractable; Supervision of other normal pregnancy, antepartum; and History of cesarean section on their problem list.  Patient reports no complaints.  Contractions: Not present. Vag. Bleeding: None.  Movement: Absent. Denies leaking of fluid.   The following portions of the patient's history were reviewed and updated as appropriate: allergies, current medications, past family history, past medical history, past social history, past surgical history and problem list. Problem list updated.  Objective:   Vitals:   10/15/17 0943  BP: 96/64  Pulse: 85  Weight: 145 lb 6.4 oz (66 kg)    Fetal Status: Fetal Heart Rate (bpm): 136   Movement: Absent     General:  Alert, oriented and cooperative. Patient is in no acute distress.  Skin: Skin is warm and dry. No rash noted.   Cardiovascular: Normal heart rate noted  Respiratory: Normal respiratory effort, no problems with respiration noted  Abdomen: Soft, gravid, appropriate for gestational age.  Pain/Pressure: Present     Pelvic: Cervical exam deferred        Extremities: Normal range of motion.  Edema: None  Mental Status: Normal mood and affect. Normal behavior. Normal judgment and thought content.   Assessment and Plan:  Pregnancy: G3P2002 at 4334w3d  1. History of cesarean section  2. Supervision of other normal pregnancy, antepartum - US MFM OB COMP + 14 WK; Future - Declines AFP today as she cannot have blood drawn during Ramadan as it will break the fast. Ok having it done at the next visit   Preterm labor symptoms and general obstetric precautions including but not limited to vaginal bleeding, contractions, leaking of fluid and fetal movement were  reviewed in detail with the patient. Please refer to After Visit Summary for other counseling recommendations.  Return in about 1 month (around 11/12/2017).  Future Appointments  Date Time Provider Department Center  11/13/2017  8:45 AM [facility] US 2 [facility]    Thressa ShellerHeather Hogan, CNM

## 2017-11-02 ENCOUNTER — Encounter (HOSPITAL_COMMUNITY): Payer: Self-pay

## 2017-11-13 ENCOUNTER — Ambulatory Visit (HOSPITAL_COMMUNITY)
Admission: RE | Admit: 2017-11-13 | Discharge: 2017-11-13 | Disposition: A | Discharge status: Home / Self Care | Payer: Medicaid Other | Source: Ambulatory Visit | Attending: Advanced Practice Midwife | Admitting: Advanced Practice Midwife

## 2017-11-13 ENCOUNTER — Other Ambulatory Visit: Payer: Self-pay | Admitting: Advanced Practice Midwife

## 2017-11-13 ENCOUNTER — Ambulatory Visit (INDEPENDENT_AMBULATORY_CARE_PROVIDER_SITE_OTHER): Payer: Medicaid Other | Admitting: Advanced Practice Midwife

## 2017-11-13 VITALS — BP 106/77 | HR 82 | Wt 146.5 lb

## 2017-11-13 DIAGNOSIS — Z3A20 20 weeks gestation of pregnancy: Secondary | ICD-10-CM | POA: Diagnosis not present

## 2017-11-13 DIAGNOSIS — Z3480 Encounter for supervision of other normal pregnancy, unspecified trimester: Secondary | ICD-10-CM

## 2017-11-13 DIAGNOSIS — Z3492 Encounter for supervision of normal pregnancy, unspecified, second trimester: Secondary | ICD-10-CM | POA: Insufficient documentation

## 2017-11-13 DIAGNOSIS — O34219 Maternal care for unspecified type scar from previous cesarean delivery: Secondary | ICD-10-CM

## 2017-11-13 DIAGNOSIS — Z363 Encounter for antenatal screening for malformations: Secondary | ICD-10-CM | POA: Insufficient documentation

## 2017-11-13 DIAGNOSIS — O219 Vomiting of pregnancy, unspecified: Secondary | ICD-10-CM

## 2017-11-13 DIAGNOSIS — Z98891 History of uterine scar from previous surgery: Secondary | ICD-10-CM

## 2017-11-13 MED ORDER — METOCLOPRAMIDE HCL 10 MG PO TABS: 10.0000 mg | ORAL_TABLET | Freq: Three times a day (TID) | ORAL | 2 refills | Status: DC

## 2017-11-13 MED ORDER — ONDANSETRON 8 MG PO TBDP: 8.0000 mg | ORAL_TABLET | Freq: Three times a day (TID) | ORAL | 2 refills | Status: DC | PRN

## 2017-11-13 NOTE — Progress Notes (Signed)
   PRENATAL VISIT NOTE  Subjective:  Samantha [name] is a 32 y.o. G3P2002 at 3540w4d being seen today for ongoing prenatal care.  She is currently monitored for the following issues for this low-risk pregnancy and has History of VBAC; Migraine without aura and without status migrainosus, not intractable; Supervision of other normal pregnancy, antepartum; and History of cesarean section on their problem list.  Patient reports nausea, vomiting and low appetite.  Contractions: Irritability. Vag. Bleeding: None.  Movement: Present. Denies leaking of fluid.   The following portions of the patient's history were reviewed and updated as appropriate: allergies, current medications, past family history, past medical history, past social history, past surgical history and problem list. Problem list updated.  Objective:   Vitals:   11/13/17 1054  BP: 106/77  Pulse: 82  Weight: 146 lb 8 oz (66.5 kg)    Fetal Status: Fetal Heart Rate (bpm): 132   Movement: Present     General:  Alert, oriented and cooperative. Patient is in no acute distress.  Skin: Skin is warm and dry. No rash noted.   Cardiovascular: Normal heart rate noted  Respiratory: Normal respiratory effort, no problems with respiration noted  Abdomen: Soft, gravid, appropriate for gestational age.  Pain/Pressure: Present     Pelvic: Cervical exam deferred        Extremities: Normal range of motion.  Edema: None  Mental Status: Normal mood and affect. Normal behavior. Normal judgment and thought content.   Assessment and Plan:  Pregnancy: G3P2002 at 7840w4d  1. History of cesarean section --x1 with successful VBAC. Desires TOLAC again.  2. Supervision of other normal pregnancy, antepartum --Anticipatory guidance about next visits/weeks of pregnancy given.  3. Nausea and vomiting during pregnancy prior to [redacted] weeks gestation --Small frequent meals, bland foods,  --Reglan 10 mg TID with meals - ondansetron (ZOFRAN ODT) 8 MG disintegrating  tablet; Take 1 tablet (8 mg total) by mouth every 8 (eight) hours as needed for nausea or vomiting.  Dispense: 20 tablet; Refill: 2  Preterm labor symptoms and general obstetric precautions including but not limited to vaginal bleeding, contractions, leaking of fluid and fetal movement were reviewed in detail with the patient. Please refer to After Visit Summary for other counseling recommendations.  Return in about 1 month (around 12/11/2017).  No future appointments.  Sharen CounterLisa Leftwich-Kirby, CNM

## 2017-11-13 NOTE — Patient Instructions (Addendum)
For nausea Take Reglan 3 times per day with meals. Take Zofran as needed if you still have nausea or vomiting. Eat small frequent meals.  For leg cramps Take magnesium every night    Second Trimester of Pregnancy The second trimester is from week 14 through week 27 (months 4 through 6). The second trimester is often a time when you feel your best. Your body has adjusted to being pregnant, and you begin to feel better physically. Usually, morning sickness has lessened or quit completely, you may have more energy, and you may have an increase in appetite. The second trimester is also a time when the fetus is growing rapidly. At the end of the sixth month, the fetus is about 9 inches long and weighs about 1 pounds. You will likely begin to feel the baby move (quickening) between 16 and 20 weeks of pregnancy. Body changes during your second trimester Your body continues to go through many changes during your second trimester. The changes vary from woman to woman.  Your weight will continue to increase. You will notice your lower abdomen bulging out.  You may begin to get stretch marks on your hips, abdomen, and breasts.  You may develop headaches that can be relieved by medicines. The medicines should be approved by your health care provider.  You may urinate more often because the fetus is pressing on your bladder.  You may develop or continue to have heartburn as a result of your pregnancy.  You may develop constipation because certain hormones are causing the muscles that push waste through your intestines to slow down.  You may develop hemorrhoids or swollen, bulging veins (varicose veins).  You may have back pain. This is caused by: ? Weight gain. ? Pregnancy hormones that are relaxing the joints in your pelvis. ? A shift in weight and the muscles that support your balance.  Your breasts will continue to grow and they will continue to become tender.  Your gums may bleed and may be  sensitive to brushing and flossing.  Dark spots or blotches (chloasma, mask of pregnancy) may develop on your face. This will likely fade after the baby is born.  A dark line from your belly button to the pubic area (linea nigra) may appear. This will likely fade after the baby is born.  You may have changes in your hair. These can include thickening of your hair, rapid growth, and changes in texture. Some women also have hair loss during or after pregnancy, or hair that feels dry or thin. Your hair will most likely return to normal after your baby is born.  What to expect at prenatal visits During a routine prenatal visit:  You will be weighed to make sure you and the fetus are growing normally.  Your blood pressure will be taken.  Your abdomen will be measured to track your baby's growth.  The fetal heartbeat will be listened to.  Any test results from the previous visit will be discussed.  Your health care provider may ask you:  How you are feeling.  If you are feeling the baby move.  If you have had any abnormal symptoms, such as leaking fluid, bleeding, severe headaches, or abdominal cramping.  If you are using any tobacco products, including cigarettes, chewing tobacco, and electronic cigarettes.  If you have any questions.  Other tests that may be performed during your second trimester include:  Blood tests that check for: ? Low iron levels (anemia). ? High blood sugar  that affects pregnant women (gestational diabetes) between 19 and 28 weeks. ? Rh antibodies. This is to check for a protein on red blood cells (Rh factor).  Urine tests to check for infections, diabetes, or protein in the urine.  An ultrasound to confirm the proper growth and development of the baby.  An amniocentesis to check for possible genetic problems.  Fetal screens for spina bifida and Down syndrome.  HIV (human immunodeficiency virus) testing. Routine prenatal testing includes screening  for HIV, unless you choose not to have this test.  Follow these instructions at home: Medicines  Follow your health care provider's instructions regarding medicine use. Specific medicines may be either safe or unsafe to take during pregnancy.  Take a prenatal vitamin that contains at least 600 micrograms (mcg) of folic acid.  If you develop constipation, try taking a stool softener if your health care provider approves. Eating and drinking  Eat a balanced diet that includes fresh fruits and vegetables, whole grains, good sources of protein such as meat, eggs, or tofu, and low-fat dairy. Your health care provider will help you determine the amount of weight gain that is right for you.  Avoid raw meat and uncooked cheese. These carry germs that can cause birth defects in the baby.  If you have low calcium intake from food, talk to your health care provider about whether you should take a daily calcium supplement.  Limit foods that are high in fat and processed sugars, such as fried and sweet foods.  To prevent constipation: ? Drink enough fluid to keep your urine clear or pale yellow. ? Eat foods that are high in fiber, such as fresh fruits and vegetables, whole grains, and beans. Activity  Exercise only as directed by your health care provider. Most women can continue their usual exercise routine during pregnancy. Try to exercise for 30 minutes at least 5 days a week. Stop exercising if you experience uterine contractions.  Avoid heavy lifting, wear low heel shoes, and practice good posture.  A sexual relationship may be continued unless your health care provider directs you otherwise. Relieving pain and discomfort  Wear a good support bra to prevent discomfort from breast tenderness.  Take warm sitz baths to soothe any pain or discomfort caused by hemorrhoids. Use hemorrhoid cream if your health care provider approves.  Rest with your legs elevated if you have leg cramps or low  back pain.  If you develop varicose veins, wear support hose. Elevate your feet for 15 minutes, 3-4 times a day. Limit salt in your diet. Prenatal Care  Write down your questions. Take them to your prenatal visits.  Keep all your prenatal visits as told by your health care provider. This is important. Safety  Wear your seat belt at all times when driving.  Make a list of emergency phone numbers, including numbers for family, friends, the hospital, and police and fire departments. General instructions  Ask your health care provider for a referral to a local prenatal education class. Begin classes no later than the beginning of month 6 of your pregnancy.  Ask for help if you have counseling or nutritional needs during pregnancy. Your health care provider can offer advice or refer you to specialists for help with various needs.  Do not use hot tubs, steam rooms, or saunas.  Do not douche or use tampons or scented sanitary pads.  Do not cross your legs for long periods of time.  Avoid cat litter boxes and soil used by  cats. These carry germs that can cause birth defects in the baby and possibly loss of the fetus by miscarriage or stillbirth.  Avoid all smoking, herbs, alcohol, and unprescribed drugs. Chemicals in these products can affect the formation and growth of the baby.  Do not use any products that contain nicotine or tobacco, such as cigarettes and e-cigarettes. If you need help quitting, ask your health care provider.  Visit your dentist if you have not gone yet during your pregnancy. Use a soft toothbrush to brush your teeth and be gentle when you floss. Contact a health care provider if:  You have dizziness.  You have mild pelvic cramps, pelvic pressure, or nagging pain in the abdominal area.  You have persistent nausea, vomiting, or diarrhea.  You have a bad smelling vaginal discharge.  You have pain when you urinate. Get help right away if:  You have a  fever.  You are leaking fluid from your vagina.  You have spotting or bleeding from your vagina.  You have severe abdominal cramping or pain.  You have rapid weight gain or weight loss.  You have shortness of breath with chest pain.  You notice sudden or extreme swelling of your face, hands, ankles, feet, or legs.  You have not felt your baby move in over an hour.  You have severe headaches that do not go away when you take medicine.  You have vision changes. Summary  The second trimester is from week 14 through week 27 (months 4 through 6). It is also a time when the fetus is growing rapidly.  Your body goes through many changes during pregnancy. The changes vary from woman to woman.  Avoid all smoking, herbs, alcohol, and unprescribed drugs. These chemicals affect the formation and growth your baby.  Do not use any tobacco products, such as cigarettes, chewing tobacco, and e-cigarettes. If you need help quitting, ask your health care provider.  Contact your health care provider if you have any questions. Keep all prenatal visits as told by your health care provider. This is important. This information is not intended to replace advice given to you by your health care provider. Make sure you discuss any questions you have with your health care provider. Document Released: 04/25/2001 Document Revised: 06/06/2016 Document Reviewed: 06/06/2016 Elsevier Interactive Patient Education  [employer].

## 2017-12-17 ENCOUNTER — Ambulatory Visit (INDEPENDENT_AMBULATORY_CARE_PROVIDER_SITE_OTHER): Payer: Medicaid Other | Admitting: Advanced Practice Midwife

## 2017-12-17 VITALS — BP 101/59 | HR 80 | Wt 148.0 lb

## 2017-12-17 DIAGNOSIS — Z789 Other specified health status: Secondary | ICD-10-CM

## 2017-12-17 DIAGNOSIS — O219 Vomiting of pregnancy, unspecified: Secondary | ICD-10-CM

## 2017-12-17 DIAGNOSIS — Z758 Other problems related to medical facilities and other health care: Secondary | ICD-10-CM

## 2017-12-17 DIAGNOSIS — Z98891 History of uterine scar from previous surgery: Secondary | ICD-10-CM

## 2017-12-17 DIAGNOSIS — Z3482 Encounter for supervision of other normal pregnancy, second trimester: Secondary | ICD-10-CM

## 2017-12-17 NOTE — Progress Notes (Signed)
   PRENATAL VISIT NOTE  Subjective:  Samantha [name] is a 32 y.o. G3P2002 at 377w3d being seen today for ongoing prenatal care.  She is currently monitored for the following issues for this low-risk pregnancy and has History of VBAC; Migraine without aura and without status migrainosus, not intractable; Supervision of other normal pregnancy, antepartum; and History of cesarean section on their problem list.  Patient reports no complaints.  Contractions: Irritability. Vag. Bleeding: None.  Movement: Present. Denies leaking of fluid.   The following portions of the patient's history were reviewed and updated as appropriate: allergies, current medications, past family history, past medical history, past social history, past surgical history and problem list. Problem list updated.  Objective:   Vitals:   12/17/17 1106  BP: (!) 101/59  Pulse: 80  Weight: 148 lb (67.1 kg)    Fetal Status: Fetal Heart Rate (bpm): 144 Fundal Height: 25 cm Movement: Present     General:  Alert, oriented and cooperative. Patient is in no acute distress.  Skin: Skin is warm and dry. No rash noted.   Cardiovascular: Normal heart rate noted  Respiratory: Normal respiratory effort, no problems with respiration noted  Abdomen: Soft, gravid, appropriate for gestational age.  Pain/Pressure: Present     Pelvic: Cervical exam deferred        Extremities: Normal range of motion.  Edema: None  Mental Status: Normal mood and affect. Normal behavior. Normal judgment and thought content.   Assessment and Plan:  Pregnancy: G3P2002 at 5577w3d  1. Encounter for supervision of other normal pregnancy in second trimester --No complaints today, continue routine care --Third trimester labs, fasting 2 hr GTT next visit  2. History of vaginal birth after cesarean --Continues to desire TOLAC  3. Nausea and vomiting during pregnancy --Resolved with diet modification  4. Language barrier --Medical interpreter present for all  patient interaction  Preterm labor symptoms and general obstetric precautions including but not limited to vaginal bleeding, contractions, leaking of fluid and fetal movement were reviewed in detail with the patient. Please refer to After Visit Summary for other counseling recommendations.  Return in about 1 month (around 01/14/2018) for 2 hr GTT.  No future appointments.  Calvert CantorSamantha C Banita [name], CNM  12/17/17  11:24 AM

## 2017-12-17 NOTE — Patient Instructions (Signed)

## 2018-01-19 ENCOUNTER — Other Ambulatory Visit: Payer: Self-pay | Admitting: Obstetrics and Gynecology

## 2018-01-19 ENCOUNTER — Other Ambulatory Visit: Payer: Self-pay | Admitting: Advanced Practice Midwife

## 2018-01-21 ENCOUNTER — Ambulatory Visit (INDEPENDENT_AMBULATORY_CARE_PROVIDER_SITE_OTHER): Payer: Medicaid Other | Admitting: Advanced Practice Midwife

## 2018-01-21 ENCOUNTER — Other Ambulatory Visit: Payer: Medicaid Other

## 2018-01-21 ENCOUNTER — Encounter: Payer: Self-pay | Admitting: Advanced Practice Midwife

## 2018-01-21 VITALS — BP 108/76 | HR 101 | Temp 98.3°F | Wt 152.0 lb

## 2018-01-21 DIAGNOSIS — Z789 Other specified health status: Secondary | ICD-10-CM

## 2018-01-21 DIAGNOSIS — Z23 Encounter for immunization: Secondary | ICD-10-CM

## 2018-01-21 DIAGNOSIS — Z3480 Encounter for supervision of other normal pregnancy, unspecified trimester: Secondary | ICD-10-CM

## 2018-01-21 DIAGNOSIS — Z3482 Encounter for supervision of other normal pregnancy, second trimester: Secondary | ICD-10-CM

## 2018-01-21 DIAGNOSIS — Z758 Other problems related to medical facilities and other health care: Secondary | ICD-10-CM | POA: Insufficient documentation

## 2018-01-21 MED ORDER — BUTALBITAL-APAP-CAFFEINE 50-325-40 MG PO TABS: 1.0000 | ORAL_TABLET | Freq: Four times a day (QID) | ORAL | 0 refills | Status: DC | PRN

## 2018-01-21 NOTE — Progress Notes (Signed)
   PRENATAL VISIT NOTE  Subjective:  Samantha [name] is a 32 y.o. G3P2002 at [redacted]w[redacted]d being seen today for ongoing prenatal care.  She is currently monitored for the following issues for this low-risk pregnancy and has History of VBAC; Migraine without aura and without status migrainosus, not intractable; Supervision of other normal pregnancy, antepartum; History of cesarean section; and Language barrier on their problem list.  Patient reports no complaints.  Contractions: Irritability. Vag. Bleeding: None.  Movement: Present. Denies leaking of fluid.   The following portions of the patient's history were reviewed and updated as appropriate: allergies, current medications, past family history, past medical history, past social history, past surgical history and problem list. Problem list updated.  Objective:   Vitals:   01/21/18 0919  BP: 108/76  Pulse: (!) 101  Temp: 98.3 F (36.8 C)  Weight: 152 lb (68.9 kg)    Fetal Status: Fetal Heart Rate (bpm): 140   Movement: Present     General:  Alert, oriented and cooperative. Patient is in no acute distress.  Skin: Skin is warm and dry. No rash noted.   Cardiovascular: Normal heart rate noted  Respiratory: Normal respiratory effort, no problems with respiration noted  Abdomen: Soft, gravid, appropriate for gestational age.  Pain/Pressure: Present     Pelvic: Cervical exam deferred        Extremities: Normal range of motion.  Edema: None  Mental Status: Normal mood and affect. Normal behavior. Normal judgment and thought content.   Assessment and Plan:  Pregnancy: G3P2002 at [redacted]w[redacted]d  1. Encounter for supervision of other normal pregnancy in second trimester - Flu Vaccine QUAD 36+ mos IM - 2hour GTT and 28 week labs today, will FU and manage accordingly   2. Need for Tdap vaccination - Tdap vaccine greater than or equal to 7yo IM  3. Language barrier - Live Intp here with patient today   Preterm labor symptoms and general obstetric  precautions including but not limited to vaginal bleeding, contractions, leaking of fluid and fetal movement were reviewed in detail with the patient. Please refer to After Visit Summary for other counseling recommendations.  Return in about 2 weeks (around 02/04/2018).  No future appointments.  Thressa Sheller, CNM

## 2018-01-22 LAB — CBC
Hematocrit: 35.9 % (ref 34.0–46.6)
Hemoglobin: 11.8 g/dL (ref 11.1–15.9)
MCH: 30.7 pg (ref 26.6–33.0)
MCHC: 32.9 g/dL (ref 31.5–35.7)
MCV: 94 fL (ref 79–97)
Platelets: 202 10*3/uL (ref 150–450)
RBC: 3.84 x10E6/uL (ref 3.77–5.28)
RDW: 13 % (ref 12.3–15.4)
WBC: 10.2 10*3/uL (ref 3.4–10.8)

## 2018-01-22 LAB — GLUCOSE TOLERANCE, 2 HOURS W/ 1HR
Glucose, 1 hour: 81 mg/dL (ref 65–179)
Glucose, 2 hour: 75 mg/dL (ref 65–152)
Glucose, Fasting: 79 mg/dL (ref 65–91)

## 2018-01-22 LAB — RPR: RPR Ser Ql: NONREACTIVE

## 2018-01-22 LAB — HIV ANTIBODY (ROUTINE TESTING W REFLEX): HIV Screen 4th Generation wRfx: NONREACTIVE

## 2018-01-23 ENCOUNTER — Inpatient Hospital Stay (HOSPITAL_COMMUNITY)
Admission: AD | Admit: 2018-01-23 | Discharge: 2018-01-24 | Disposition: A | Discharge status: Home / Self Care | Payer: Medicaid Other | Source: Ambulatory Visit | Attending: Obstetrics & Gynecology | Admitting: Obstetrics & Gynecology

## 2018-01-23 ENCOUNTER — Telehealth: Payer: Self-pay

## 2018-01-23 DIAGNOSIS — G44209 Tension-type headache, unspecified, not intractable: Secondary | ICD-10-CM | POA: Diagnosis not present

## 2018-01-23 DIAGNOSIS — Z820 Family history of epilepsy and other diseases of the nervous system: Secondary | ICD-10-CM | POA: Insufficient documentation

## 2018-01-23 DIAGNOSIS — Z3A30 30 weeks gestation of pregnancy: Secondary | ICD-10-CM | POA: Diagnosis not present

## 2018-01-23 DIAGNOSIS — J020 Streptococcal pharyngitis: Secondary | ICD-10-CM

## 2018-01-23 DIAGNOSIS — J029 Acute pharyngitis, unspecified: Secondary | ICD-10-CM | POA: Diagnosis not present

## 2018-01-23 DIAGNOSIS — O99513 Diseases of the respiratory system complicating pregnancy, third trimester: Secondary | ICD-10-CM | POA: Diagnosis not present

## 2018-01-23 DIAGNOSIS — G44201 Tension-type headache, unspecified, intractable: Secondary | ICD-10-CM

## 2018-01-23 DIAGNOSIS — Z825 Family history of asthma and other chronic lower respiratory diseases: Secondary | ICD-10-CM | POA: Diagnosis not present

## 2018-01-23 DIAGNOSIS — Z79899 Other long term (current) drug therapy: Secondary | ICD-10-CM | POA: Insufficient documentation

## 2018-01-23 DIAGNOSIS — O34219 Maternal care for unspecified type scar from previous cesarean delivery: Secondary | ICD-10-CM | POA: Insufficient documentation

## 2018-01-23 DIAGNOSIS — Z3480 Encounter for supervision of other normal pregnancy, unspecified trimester: Secondary | ICD-10-CM

## 2018-01-23 DIAGNOSIS — O99353 Diseases of the nervous system complicating pregnancy, third trimester: Secondary | ICD-10-CM | POA: Diagnosis not present

## 2018-01-23 DIAGNOSIS — O9989 Other specified diseases and conditions complicating pregnancy, childbirth and the puerperium: Secondary | ICD-10-CM | POA: Diagnosis not present

## 2018-01-23 DIAGNOSIS — R52 Pain, unspecified: Secondary | ICD-10-CM | POA: Diagnosis present

## 2018-01-23 DIAGNOSIS — B9789 Other viral agents as the cause of diseases classified elsewhere: Secondary | ICD-10-CM

## 2018-01-23 DIAGNOSIS — J028 Acute pharyngitis due to other specified organisms: Secondary | ICD-10-CM

## 2018-01-23 LAB — URINALYSIS, MICROSCOPIC (REFLEX)

## 2018-01-23 LAB — URINALYSIS, ROUTINE W REFLEX MICROSCOPIC
Bilirubin Urine: NEGATIVE
Glucose, UA: NEGATIVE mg/dL
Ketones, ur: 15 mg/dL — AB
Nitrite: NEGATIVE
Protein, ur: NEGATIVE mg/dL
Specific Gravity, Urine: <1.005 — ABNORMAL LOW (ref 1.005–1.030)
pH: 6.5 (ref 5.0–8.0)

## 2018-01-23 LAB — INFLUENZA PANEL BY PCR (TYPE A & B)
Influenza A By PCR: NEGATIVE
Influenza B By PCR: NEGATIVE

## 2018-01-23 LAB — GROUP A STREP BY PCR: Group A Strep by PCR: DETECTED — AB

## 2018-01-23 MED ORDER — LACTATED RINGERS IV BOLUS
1000.0000 mL | Freq: Once | INTRAVENOUS | Status: AC
  Administered 2018-01-23: 1000 mL via INTRAVENOUS

## 2018-01-23 MED ORDER — DEXAMETHASONE SODIUM PHOSPHATE 10 MG/ML IJ SOLN
10.0000 mg | Freq: Once | INTRAMUSCULAR | Status: AC
  Administered 2018-01-23: 10 mg via INTRAVENOUS
  Filled 2018-01-23: qty 1

## 2018-01-23 MED ORDER — MENTHOL 3 MG MT LOZG
1.0000 | LOZENGE | OROMUCOSAL | Status: DC | PRN
  Filled 2018-01-23: qty 9

## 2018-01-23 MED ORDER — DIPHENHYDRAMINE HCL 50 MG/ML IJ SOLN
25.0000 mg | Freq: Once | INTRAMUSCULAR | Status: AC
  Administered 2018-01-23: 25 mg via INTRAVENOUS
  Filled 2018-01-23: qty 1

## 2018-01-23 MED ORDER — METOCLOPRAMIDE HCL 5 MG/ML IJ SOLN
10.0000 mg | Freq: Once | INTRAMUSCULAR | Status: AC
  Administered 2018-01-23: 10 mg via INTRAVENOUS
  Filled 2018-01-23: qty 2

## 2018-01-23 NOTE — MAU Note (Signed)
Pt here for sore throat, runny nose, body aches and headaches that started about 1 week ago. Pt denies fever. Was seen at doctor recently as well for the same complaints. Pt she took Tylenol but it has not helped. Pt states she has a sick child at home.

## 2018-01-23 NOTE — Telephone Encounter (Signed)
Called pt to verify what medication she needed to be refilled. Pt's husband left the message and was unclear. Called pt no voicemail was set up.

## 2018-01-23 NOTE — MAU Provider Note (Signed)
History     CSN: [id number]  Arrival date and time: 01/23/18 7846   First Provider Initiated Contact with Patient 01/23/18 2143      Chief Complaint  Patient presents with  . Generalized Body Aches  . Sore Throat  . Headache   Samantha [name] is a 32 y.o. G3P2 at [redacted]w[redacted]d who presents to MAU with complaints of body aches, sore throat and headache. She reports symptoms began to occurring 1 week ago. Lives in house with multiple families and reports one of the children that she lives in the house with has been sick with the flu. She rates headache and generalized body aches 7/10- has tried Tylenol and Fioricet with no relief of HA. She denies fever or cough. Reports symptoms is associated with a runny nose- she has not taken any cold or cough medication since symptoms started occurring. She denies abdominal cramping or contractions, vaginal bleeding or LOF. She denies hx of complications during this pregnancy. Next prenatal appointment is scheduled for 9/23.    OB History    Gravida  3   Para  2   Term  2   Preterm      AB      Living  2     SAB      TAB      Ectopic      Multiple  0   Live Births  2           Past Medical History:  Diagnosis Date  . Migraine     Past Surgical History:  Procedure Laterality Date  . CESAREAN SECTION      Family History  Problem Relation Age of Onset  . Asthma Father   . Migraines Mother     Social History   Tobacco Use  . Smoking status: Never Smoker  . Smokeless tobacco: Never Used  Substance Use Topics  . Alcohol use: No  . Drug use: No    Allergies: No Known Allergies  Medications Prior to Admission  Medication Sig Dispense Refill Last Dose  . acetaminophen (TYLENOL) 650 MG CR tablet Take 650 mg by mouth every 8 (eight) hours as needed for pain.   Taking  . butalbital-acetaminophen-caffeine (FIORICET, ESGIC) 50-325-40 MG tablet Take 1-2 tablets by mouth every 6 (six) hours as needed for headache. 20 tablet 0    . butalbital-acetaminophen-caffeine (FIORICET/CODEINE) 50-325-40-30 MG capsule Take 1 capsule by mouth every 6 (six) hours as needed for headache or migraine. 30 capsule 0 Taking  . famotidine (PEPCID AC) 10 MG chewable tablet Chew 2 tablets (20 mg total) by mouth 2 (two) times daily as needed for heartburn. 60 tablet 3 Taking  . metoCLOPramide (REGLAN) 10 MG tablet Take 1 tablet (10 mg total) by mouth 3 (three) times daily with meals. 90 tablet 2 Taking  . ondansetron (ZOFRAN ODT) 8 MG disintegrating tablet Take 1 tablet (8 mg total) by mouth every 8 (eight) hours as needed for nausea or vomiting. 20 tablet 2 Taking  . Prenatal Multivit-Min-Fe-FA (PRENATAL VITAMINS) 0.8 MG tablet Take 1 tablet by mouth daily. 30 tablet 12 Taking  . prenatal vitamin w/FE, FA (PRENATAL 1 + 1) 27-1 MG TABS tablet Take 1 tablet by mouth daily at 12 noon.   Taking    Review of Systems  Constitutional: Negative for chills and fever.       Generalized body aches  HENT: Positive for sore throat. Negative for congestion.        Runny nose  Respiratory: Negative.   Cardiovascular: Negative.   Gastrointestinal: Negative.   Genitourinary: Negative.   Neurological: Positive for headaches. Negative for dizziness, syncope, weakness and light-headedness.   Physical Exam   Blood pressure 108/65, pulse 89, temperature 98 F (36.7 C), temperature source Oral, resp. rate 16, last menstrual period 06/22/2017, SpO2 96 %, currently breastfeeding.  Physical Exam  Nursing note and vitals reviewed. Constitutional: She is oriented to person, place, and time. She appears well-developed and well-nourished. She appears distressed.  HENT:  Head: Normocephalic.  Cardiovascular: Normal rate, regular rhythm and normal heart sounds.  Respiratory: Effort normal and breath sounds normal. No respiratory distress. She has no wheezes.  GI: Soft. Bowel sounds are normal.  Gravid appropriate for gestational age  Genitourinary:   Genitourinary Comments: Pelvic examination deferred  Musculoskeletal: Normal range of motion. She exhibits no edema.  Neurological: She is alert and oriented to person, place, and time.  Skin: Skin is warm and dry.  Psychiatric: She has a normal mood and affect. Her behavior is normal. Thought content normal.   FHR: 120/ moderate variability/ +accels/ no decelerations  Toco: no uterine contractions   MAU Course  Procedures  MDM Rapid strep  Influenza swab  UA Headache cocktail by IV including LR bolus, benadryl, decadron, and reglan IV Meds ordered this encounter  Medications  . AND Linked Order Group   . diphenhydrAMINE (BENADRYL) injection 25 mg   . metoCLOPramide (REGLAN) injection 10 mg   . dexamethasone (DECADRON) injection 10 mg  . lactated ringers bolus 1,000 mL  . menthol-cetylpyridinium (CEPACOL) lozenge 3 mg  . penicillin v potassium (VEETID) 500 MG tablet    Sig: Take 1 tablet (500 mg total) by mouth 2 (two) times daily.    Dispense:  20 tablet    Refill:  0    Order Specific Question:   Supervising Provider    Answer:   Lazaro Arms [[id number]]   Lab results reviewed:  Results for orders placed or performed during the hospital encounter of 01/23/18 (from the past 24 hour(s))  Urinalysis, Routine w reflex microscopic     Status: Abnormal   Collection Time: 01/23/18  9:24 PM  Result Value Ref Range   Color, Urine YELLOW YELLOW   APPearance CLEAR CLEAR   Specific Gravity, Urine <1.005 (L) 1.005 - 1.030   pH 6.5 5.0 - 8.0   Glucose, UA NEGATIVE NEGATIVE mg/dL   Hgb urine dipstick TRACE (A) NEGATIVE   Bilirubin Urine NEGATIVE NEGATIVE   Ketones, ur 15 (A) NEGATIVE mg/dL   Protein, ur NEGATIVE NEGATIVE mg/dL   Nitrite NEGATIVE NEGATIVE   Leukocytes, UA SMALL (A) NEGATIVE  Urinalysis, Microscopic (reflex)     Status: Abnormal   Collection Time: 01/23/18  9:24 PM  Result Value Ref Range   RBC / HPF 0-5 0 - 5 RBC/hpf   WBC, UA 0-5 0 - 5 WBC/hpf   Bacteria, UA  FEW (A) NONE SEEN   Squamous Epithelial / LPF 0-5 0 - 5  Influenza panel by PCR (type A & B)     Status: None   Collection Time: 01/23/18 10:00 PM  Result Value Ref Range   Influenza A By PCR NEGATIVE NEGATIVE   Influenza B By PCR NEGATIVE NEGATIVE  Group A Strep by PCR     Status: Abnormal   Collection Time: 01/23/18 10:25 PM  Result Value Ref Range   Group A Strep by PCR DETECTED (A) NOT DETECTED   Rapid Strep- positive,  Influenza  negative   Discussed results of labs with patient. Patient reports HA is relieved after treatment in MAU. Rx for penicillin sent to pharmacy of choice for treatment of strep throat. Discussed reasons to return to MAU. Educated on strep and not to share foods or drinks with family or others. Patient verbalizes understanding. Pt stable at time of discharge.   Assessment and Plan   1. Pharyngitis due to Streptococcus species   2. Sore throat (viral)   3. [redacted] weeks gestation of pregnancy   4. Acute intractable tension-type headache    Discharge home  Follow up as scheduled for prenatal appointments  Return to MAU as needed  Rx for penicillin  Continue prenatal vitamins  Educated on strep throat   Follow-up Information    [facility].   Specialty:  Obstetrics and Gynecology Why:  Follow up as scheduled for prenatal appointment  Contact information: [address] [place] [address] [phone]         Allergies as of 01/24/2018   No Known Allergies     Medication List    TAKE these medications   acetaminophen 650 MG CR tablet Commonly known as:  TYLENOL Take 650 mg by mouth every 8 (eight) hours as needed for pain.   butalbital-acetaminophen-caffeine 50-325-40 MG tablet Commonly known as:  FIORICET, ESGIC Take 1-2 tablets by mouth every 6 (six) hours as needed for headache.   butalbital-acetaminophen-caffeine 50-325-40-30 MG capsule Commonly known as:  FIORICET WITH CODEINE Take 1  capsule by mouth every 6 (six) hours as needed for headache or migraine.   famotidine 10 MG chewable tablet Commonly known as:  PEPCID AC Chew 2 tablets (20 mg total) by mouth 2 (two) times daily as needed for heartburn.   metoCLOPramide 10 MG tablet Commonly known as:  REGLAN Take 1 tablet (10 mg total) by mouth 3 (three) times daily with meals.   ondansetron 8 MG disintegrating tablet Commonly known as:  ZOFRAN-ODT Take 1 tablet (8 mg total) by mouth every 8 (eight) hours as needed for nausea or vomiting.   penicillin v potassium 500 MG tablet Commonly known as:  VEETID Take 1 tablet (500 mg total) by mouth 2 (two) times daily.   prenatal vitamin w/FE, FA 27-1 MG Tabs tablet Take 1 tablet by mouth daily at 12 noon.   Prenatal Vitamins 0.8 MG tablet Take 1 tablet by mouth daily.       Sharyon Cable CNM 01/23/2018, 11:02 PM

## 2018-01-24 DIAGNOSIS — Z3A30 30 weeks gestation of pregnancy: Secondary | ICD-10-CM

## 2018-01-24 DIAGNOSIS — J020 Streptococcal pharyngitis: Secondary | ICD-10-CM

## 2018-01-24 DIAGNOSIS — G44201 Tension-type headache, unspecified, intractable: Secondary | ICD-10-CM

## 2018-01-24 DIAGNOSIS — O9989 Other specified diseases and conditions complicating pregnancy, childbirth and the puerperium: Secondary | ICD-10-CM

## 2018-01-24 DIAGNOSIS — J029 Acute pharyngitis, unspecified: Secondary | ICD-10-CM

## 2018-01-24 MED ORDER — PENICILLIN V POTASSIUM 500 MG PO TABS: 500.0000 mg | ORAL_TABLET | Freq: Two times a day (BID) | ORAL | 0 refills | Status: DC

## 2018-01-24 NOTE — Progress Notes (Signed)
Pt opted for husband to be her interpreter, consent signed and witnessed

## 2018-01-28 MED ORDER — ONDANSETRON 8 MG PO TBDP: 8.0000 mg | ORAL_TABLET | Freq: Three times a day (TID) | ORAL | 2 refills | Status: DC | PRN

## 2018-01-28 MED ORDER — METOCLOPRAMIDE HCL 10 MG PO TABS: 10.0000 mg | ORAL_TABLET | Freq: Three times a day (TID) | ORAL | 2 refills | Status: DC

## 2018-01-28 NOTE — Telephone Encounter (Signed)
Called pt and pt's husband requested to have a refill on her nausea/vomiting medication.  Per Dr. Adrian BlackwaterStinson, pt can have a refill on her Zofran/Reglan medication.

## 2018-02-04 ENCOUNTER — Ambulatory Visit (INDEPENDENT_AMBULATORY_CARE_PROVIDER_SITE_OTHER): Payer: Medicaid Other | Admitting: Nurse Practitioner

## 2018-02-04 VITALS — BP 120/69 | HR 86 | Wt 152.6 lb

## 2018-02-04 DIAGNOSIS — Z789 Other specified health status: Secondary | ICD-10-CM

## 2018-02-04 DIAGNOSIS — Z3480 Encounter for supervision of other normal pregnancy, unspecified trimester: Secondary | ICD-10-CM

## 2018-02-04 DIAGNOSIS — Z98891 History of uterine scar from previous surgery: Secondary | ICD-10-CM

## 2018-02-04 NOTE — Progress Notes (Signed)
Live Interpreter [facility]

## 2018-02-04 NOTE — Progress Notes (Signed)
    Subjective:  Samantha [name] is a 32 y.o. G3P2002 at 3428w3d being seen today for ongoing prenatal care.  She is currently monitored for the following issues for this low-risk pregnancy and has History of VBAC; Migraine without aura and without status migrainosus, not intractable; Supervision of other normal pregnancy, antepartum; History of cesarean section; and Language barrier on their problem list.  Patient reports Noticed one cramp this morning on awakening and none since then..  Contractions: Irritability. Vag. Bleeding: None.  Movement: Present. Denies leaking of fluid.   The following portions of the patient's history were reviewed and updated as appropriate: allergies, current medications, past family history, past medical history, past social history, past surgical history and problem list. Problem list updated.  Objective:   Vitals:   02/04/18 1406  BP: 120/69  Pulse: 86  Weight: 152 lb 9.6 oz (69.2 kg)    Fetal Status: Fetal Heart Rate (bpm): 131 Fundal Height: 32 cm Movement: Present     General:  Alert, oriented and cooperative. Patient is in no acute distress.  Skin: Skin is warm and dry. No rash noted.   Cardiovascular: Normal heart rate noted  Respiratory: Normal respiratory effort, no problems with respiration noted  Abdomen: Soft, gravid, appropriate for gestational age. Pain/Pressure: Present     Pelvic:  Cervical exam deferred        Extremities: Normal range of motion.  Edema: Trace  Mental Status: Normal mood and affect. Normal behavior. Normal judgment and thought content.   Urinalysis:      Assessment and Plan:  Pregnancy: G3P2002 at 3728w3d  1. Supervision of other normal pregnancy, antepartum Drink at least 8 8-oz glasses of water every day. Advised to come to [facility]U if she is having recurrent problems with pain or vaginal bleeding or leaking.  2. History of cesarean section Signed VBAC consent - discussed previously but no signed consent in media  tab.  3. Language barrier Contract interpreter present for the entire visit  Preterm labor symptoms and general obstetric precautions including but not limited to vaginal bleeding, contractions, leaking of fluid and fetal movement were reviewed in detail with the patient. Please refer to After Visit Summary for other counseling recommendations.  Return in about 2 weeks (around 02/18/2018).  Nolene BernheimERRI BURLESON, RN, MSN, NP-BC Nurse Practitioner, [facility], [facility] 02/04/2018 2:33 PM

## 2018-02-19 ENCOUNTER — Ambulatory Visit (INDEPENDENT_AMBULATORY_CARE_PROVIDER_SITE_OTHER): Payer: Medicaid Other | Admitting: Obstetrics and Gynecology

## 2018-02-19 VITALS — BP 115/68 | HR 88 | Wt 154.0 lb

## 2018-02-19 DIAGNOSIS — Z3480 Encounter for supervision of other normal pregnancy, unspecified trimester: Secondary | ICD-10-CM

## 2018-02-19 DIAGNOSIS — O9989 Other specified diseases and conditions complicating pregnancy, childbirth and the puerperium: Secondary | ICD-10-CM

## 2018-02-19 DIAGNOSIS — O99891 Other specified diseases and conditions complicating pregnancy: Secondary | ICD-10-CM | POA: Insufficient documentation

## 2018-02-19 DIAGNOSIS — M549 Dorsalgia, unspecified: Secondary | ICD-10-CM

## 2018-02-19 MED ORDER — RANITIDINE HCL 150 MG PO TABS: 150.0000 mg | ORAL_TABLET | Freq: Two times a day (BID) | ORAL | 1 refills | Status: DC

## 2018-02-19 MED ORDER — COMFORT FIT MATERNITY SUPP LG MISC: 1.0000 | Freq: Every day | 0 refills | Status: AC | PRN

## 2018-02-19 MED ORDER — CYCLOBENZAPRINE HCL 10 MG PO TABS: 10.0000 mg | ORAL_TABLET | Freq: Three times a day (TID) | ORAL | 0 refills | Status: DC | PRN

## 2018-02-19 NOTE — Patient Instructions (Signed)

## 2018-02-19 NOTE — Progress Notes (Signed)
   PRENATAL VISIT NOTE  Subjective:  Samantha [name] is a 32 y.o. G3P2002 at [redacted]w[redacted]d being seen today for ongoing prenatal care.  She is currently monitored for the following issues for this low-risk pregnancy and has History of VBAC; Migraine without aura and without status migrainosus, not intractable; Supervision of other normal pregnancy, antepartum; History of cesarean section; Language barrier; and Back pain affecting pregnancy in third trimester on their problem list.  Patient reports backache. Backache has been present for 12 weeks.   Contractions: Irritability. Vag. Bleeding: None.  Movement: Present. Denies leaking of fluid.   The following portions of the patient's history were reviewed and updated as appropriate: allergies, current medications, past family history, past medical history, past social history, past surgical history and problem list. Problem list updated.  Objective:   Vitals:   02/19/18 0935  BP: 115/68  Pulse: 88  Weight: 154 lb (69.9 kg)    Fetal Status: Fetal Heart Rate (bpm): 140 Fundal Height: 32 cm Movement: Present     General:  Alert, oriented and cooperative. Patient is in no acute distress.  Skin: Skin is warm and dry. No rash noted.   Cardiovascular: Normal heart rate noted  Respiratory: Normal respiratory effort, no problems with respiration noted  Abdomen: Soft, gravid, appropriate for gestational age.  Pain/Pressure: Present     Pelvic: Cervical exam deferred        Extremities: Normal range of motion.  Edema: Trace  Mental Status: Normal mood and affect. Normal behavior. Normal judgment and thought content.   Assessment and Plan:  Pregnancy: G3P2002 at [redacted]w[redacted]d  1. Supervision of other normal pregnancy, antepartum  - Doing well.  - Interpretor used.   2. Back pain affecting pregnancy in third trimester   Other orders - Elastic Bandages & Supports (COMFORT FIT MATERNITY SUPP LG) MISC; 1 each by Does not apply route daily as needed. -  cyclobenzaprine (FLEXERIL) 10 MG tablet; Take 1 tablet (10 mg total) by mouth every 8 (eight) hours as needed for muscle spasms. - ranitidine (ZANTAC) 150 MG tablet; Take 1 tablet (150 mg total) by mouth 2 (two) times daily.  Preterm labor symptoms and general obstetric precautions including but not limited to vaginal bleeding, contractions, leaking of fluid and fetal movement were reviewed in detail with the patient. Please refer to After Visit Summary for other counseling recommendations.  Return in about 2 weeks (around 03/05/2018).  Future Appointments  Date Time Provider Department Center  03/04/2018  2:55 PM Armando Reichert, CNM [facility]    Venia Carbon, NP

## 2018-02-19 NOTE — Progress Notes (Deleted)
   PRENATAL VISIT NOTE  Subjective:  Samantha [name] is a 32 y.o. G3P2002 at [redacted]w[redacted]d being seen today for ongoing prenatal care.  She is currently monitored for the following issues for this low-risk pregnancy and has History of VBAC; Migraine without aura and without status migrainosus, not intractable; Supervision of other normal pregnancy, antepartum; History of cesarean section; and Language barrier on their problem list.  Patient reports backache. Lower back pain; the pain started at the time of pregnancy. She is not wearing a support belt at this time.   Contractions: Irritability. Vag. Bleeding: None.  Movement: Present. Denies leaking of fluid.   The following portions of the patient's history were reviewed and updated as appropriate: allergies, current medications, past family history, past medical history, past social history, past surgical history and problem list. Problem list updated.  Objective:   Vitals:   02/19/18 0935  BP: 115/68  Pulse: 88  Weight: 154 lb (69.9 kg)    Fetal Status: Fetal Heart Rate (bpm): 140   Movement: Present     General:  Alert, oriented and cooperative. Patient is in no acute distress.  Skin: Skin is warm and dry. No rash noted.   Cardiovascular: Normal heart rate noted  Respiratory: Normal respiratory effort, no problems with respiration noted  Abdomen: Soft, gravid, appropriate for gestational age.  Pain/Pressure: Present     Pelvic: Cervical exam deferred        Extremities: Normal range of motion.  Edema: Trace  Mental Status: Normal mood and affect. Normal behavior. Normal judgment and thought content.   Assessment and Plan:  Pregnancy: G3P2002 at [redacted]w[redacted]d  1. Supervision of other normal pregnancy, antepartum ***  {Blank single:19197::"Term","Preterm"} labor symptoms and general obstetric precautions including but not limited to vaginal bleeding, contractions, leaking of fluid and fetal movement were reviewed in detail with the patient. Please  refer to After Visit Summary for other counseling recommendations.  No follow-ups on file.  No future appointments.  Venia Carbon, NP

## 2018-02-19 NOTE — Progress Notes (Signed)
Legs sore, Lower Back pain

## 2018-03-04 ENCOUNTER — Ambulatory Visit (INDEPENDENT_AMBULATORY_CARE_PROVIDER_SITE_OTHER): Payer: Medicaid Other | Admitting: Advanced Practice Midwife

## 2018-03-04 ENCOUNTER — Other Ambulatory Visit (HOSPITAL_COMMUNITY)
Admission: RE | Admit: 2018-03-04 | Discharge: 2018-03-04 | Disposition: A | Discharge status: Home / Self Care | Payer: Medicaid Other | Source: Ambulatory Visit | Attending: Advanced Practice Midwife | Admitting: Advanced Practice Midwife

## 2018-03-04 ENCOUNTER — Encounter: Payer: Self-pay | Admitting: Advanced Practice Midwife

## 2018-03-04 VITALS — BP 111/64 | HR 77 | Wt 155.5 lb

## 2018-03-04 DIAGNOSIS — Z3483 Encounter for supervision of other normal pregnancy, third trimester: Secondary | ICD-10-CM | POA: Insufficient documentation

## 2018-03-04 DIAGNOSIS — Z3480 Encounter for supervision of other normal pregnancy, unspecified trimester: Secondary | ICD-10-CM

## 2018-03-04 NOTE — Patient Instructions (Signed)

## 2018-03-04 NOTE — Progress Notes (Signed)
   PRENATAL VISIT NOTE  Subjective:  Samantha [name] is a 32 y.o. G3P2002 at [redacted]w[redacted]d being seen today for ongoing prenatal care.  She is currently monitored for the following issues for this low-risk pregnancy and has History of VBAC; Migraine without aura and without status migrainosus, not intractable; Supervision of other normal pregnancy, antepartum; History of cesarean section; Language barrier; and Back pain affecting pregnancy in third trimester on their problem list.  Patient reports no complaints.  Contractions: Irritability. Vag. Bleeding: None.  Movement: Present. Denies leaking of fluid.   The following portions of the patient's history were reviewed and updated as appropriate: allergies, current medications, past family history, past medical history, past social history, past surgical history and problem list. Problem list updated.  Objective:   Vitals:   03/04/18 1532  BP: 111/64  Pulse: 77  Weight: 155 lb 8 oz (70.5 kg)    Fetal Status: Fetal Heart Rate (bpm): 136 Fundal Height: 36 cm Movement: Present     General:  Alert, oriented and cooperative. Patient is in no acute distress.  Skin: Skin is warm and dry. No rash noted.   Cardiovascular: Normal heart rate noted  Respiratory: Normal respiratory effort, no problems with respiration noted  Abdomen: Soft, gravid, appropriate for gestational age.  Pain/Pressure: Present     Pelvic: Cervical exam performed Dilation: 1 Effacement (%): Thick Station: -3  Extremities: Normal range of motion.  Edema: Trace  Mental Status: Normal mood and affect. Normal behavior. Normal judgment and thought content.   Assessment and Plan:  Pregnancy: G3P2002 at [redacted]w[redacted]d  1. Supervision of other normal pregnancy, antepartum - Routine care - Culture, beta strep (group b only) - Cervicovaginal ancillary only( [place])  Term labor symptoms and general obstetric precautions including but not limited to vaginal bleeding, contractions, leaking of  fluid and fetal movement were reviewed in detail with the patient. Please refer to After Visit Summary for other counseling recommendations.  Return in about 1 week (around 03/11/2018).  No future appointments.  Thressa Sheller, CNM

## 2018-03-05 LAB — CERVICOVAGINAL ANCILLARY ONLY
CHLAMYDIA, DNA PROBE: NEGATIVE
NEISSERIA GONORRHEA: NEGATIVE

## 2018-03-07 LAB — CULTURE, BETA STREP (GROUP B ONLY): Strep Gp B Culture: NEGATIVE

## 2018-03-12 ENCOUNTER — Ambulatory Visit (INDEPENDENT_AMBULATORY_CARE_PROVIDER_SITE_OTHER): Payer: Medicaid Other | Admitting: Student

## 2018-03-12 DIAGNOSIS — Z3480 Encounter for supervision of other normal pregnancy, unspecified trimester: Secondary | ICD-10-CM

## 2018-03-12 NOTE — Progress Notes (Signed)
   PRENATAL VISIT NOTE  Subjective:  Samantha [name] is a 32 y.o. G3P2002 at [redacted]w[redacted]d being seen today for ongoing prenatal care.  She is currently monitored for the following issues for this low-risk pregnancy and has History of VBAC; Migraine without aura and without status migrainosus, not intractable; Supervision of other normal pregnancy, antepartum; History of cesarean section; Language barrier; and Back pain affecting pregnancy in third trimester on their problem list.  Patient reports no complaints; desires that her cervix is checked. .  Contractions: Irritability.  .  Movement: Present. Denies leaking of fluid.   The following portions of the patient's history were reviewed and updated as appropriate: allergies, current medications, past family history, past medical history, past social history, past surgical history and problem list. Problem list updated.  Objective:   Vitals:   03/12/18 1733  BP: 120/72  Pulse: 72  Weight: 158 lb 14.4 oz (72.1 kg)    Fetal Status: Fetal Heart Rate (bpm): 137 Fundal Height: 37 cm Movement: Present     General:  Alert, oriented and cooperative. Patient is in no acute distress.  Skin: Skin is warm and dry. No rash noted.   Cardiovascular: Normal heart rate noted  Respiratory: Normal respiratory effort, no problems with respiration noted  Abdomen: Soft, gravid, appropriate for gestational age.  Pain/Pressure: Present     Pelvic: Cervical exam performed       Cervix is essentially closed, external os is open but extremely posterior  Extremities: Normal range of motion.  Edema: Trace  Mental Status: Normal mood and affect. Normal behavior. Normal judgment and thought content.   Assessment and Plan:  Pregnancy: G3P2002 at [redacted]w[redacted]d  1. Supervision of other normal pregnancy, antepartum -doing well, no complaints.  -still desires TOLAC.  -unsure about BC; she wants something with no side effects. Offered copper IUD, patient will think about it.   Term  labor symptoms and general obstetric precautions including but not limited to vaginal bleeding, contractions, leaking of fluid and fetal movement were reviewed in detail with the patient. Please refer to After Visit Summary for other counseling recommendations.  No follow-ups on file.  Future Appointments  Date Time Provider Department Center  03/18/2018 10:55 AM Armando Reichert, CNM [facility]  03/25/2018  1:55 PM Tamera Stands, DO [facility]  04/01/2018  1:15 PM Armando Reichert, CNM [facility]    Marylene Land, [place]

## 2018-03-12 NOTE — Patient Instructions (Signed)

## 2018-03-18 ENCOUNTER — Encounter: Payer: Self-pay | Admitting: Advanced Practice Midwife

## 2018-03-18 ENCOUNTER — Ambulatory Visit (INDEPENDENT_AMBULATORY_CARE_PROVIDER_SITE_OTHER): Payer: Medicaid Other | Admitting: Advanced Practice Midwife

## 2018-03-18 VITALS — BP 117/81 | HR 95 | Wt 158.0 lb

## 2018-03-18 DIAGNOSIS — Z3480 Encounter for supervision of other normal pregnancy, unspecified trimester: Secondary | ICD-10-CM

## 2018-03-18 NOTE — Progress Notes (Signed)
   PRENATAL VISIT NOTE  Subjective:  Samantha [name] is a 32 y.o. G3P2002 at [redacted]w[redacted]d being seen today for ongoing prenatal care.  She is currently monitored for the following issues for this low-risk pregnancy and has History of VBAC; Migraine without aura and without status migrainosus, not intractable; Supervision of other normal pregnancy, antepartum; History of cesarean section; Language barrier; and Back pain affecting pregnancy in third trimester on their problem list.  Patient reports no complaints.  Contractions: Irritability. Vag. Bleeding: None.  Movement: Present. Denies leaking of fluid.   The following portions of the patient's history were reviewed and updated as appropriate: allergies, current medications, past family history, past medical history, past social history, past surgical history and problem list. Problem list updated.  Objective:   Vitals:   03/18/18 1127  BP: 117/81  Pulse: 95  Weight: 158 lb (71.7 kg)    Fetal Status: Fetal Heart Rate (bpm): 142 Fundal Height: 39 cm Movement: Present     General:  Alert, oriented and cooperative. Patient is in no acute distress.  Skin: Skin is warm and dry. No rash noted.   Cardiovascular: Normal heart rate noted  Respiratory: Normal respiratory effort, no problems with respiration noted  Abdomen: Soft, gravid, appropriate for gestational age.  Pain/Pressure: Present     Pelvic: Cervical exam deferred        Extremities: Normal range of motion.  Edema: Trace  Mental Status: Normal mood and affect. Normal behavior. Normal judgment and thought content.   Assessment and Plan:  Pregnancy: G3P2002 at [redacted]w[redacted]d  1. Supervision of other normal pregnancy, antepartum - Routine care  Term labor symptoms and general obstetric precautions including but not limited to vaginal bleeding, contractions, leaking of fluid and fetal movement were reviewed in detail with the patient. Please refer to After Visit Summary for other counseling  recommendations.  Return in about 1 week (around 03/25/2018).  Future Appointments  Date Time Provider Department Center  03/25/2018  1:55 PM Heron Nay [facility]  04/01/2018  1:15 PM Armando Reichert, CNM [facility]    Thressa Sheller, CNM

## 2018-03-23 ENCOUNTER — Inpatient Hospital Stay (HOSPITAL_COMMUNITY)
Admission: AD | Admit: 2018-03-23 | Discharge: 2018-03-23 | Disposition: A | Discharge status: Home / Self Care | Payer: Medicaid Other | Source: Ambulatory Visit | Attending: Family Medicine | Admitting: Family Medicine

## 2018-03-23 ENCOUNTER — Encounter (HOSPITAL_COMMUNITY): Payer: Self-pay

## 2018-03-23 ENCOUNTER — Inpatient Hospital Stay (HOSPITAL_COMMUNITY): Payer: Medicaid Other

## 2018-03-23 DIAGNOSIS — Z98891 History of uterine scar from previous surgery: Secondary | ICD-10-CM

## 2018-03-23 DIAGNOSIS — Z3A39 39 weeks gestation of pregnancy: Secondary | ICD-10-CM | POA: Diagnosis not present

## 2018-03-23 DIAGNOSIS — R109 Unspecified abdominal pain: Secondary | ICD-10-CM

## 2018-03-23 DIAGNOSIS — O34219 Maternal care for unspecified type scar from previous cesarean delivery: Secondary | ICD-10-CM | POA: Insufficient documentation

## 2018-03-23 DIAGNOSIS — O09893 Supervision of other high risk pregnancies, third trimester: Secondary | ICD-10-CM

## 2018-03-23 DIAGNOSIS — R1031 Right lower quadrant pain: Secondary | ICD-10-CM | POA: Diagnosis present

## 2018-03-23 DIAGNOSIS — O26833 Pregnancy related renal disease, third trimester: Secondary | ICD-10-CM | POA: Insufficient documentation

## 2018-03-23 DIAGNOSIS — Z3480 Encounter for supervision of other normal pregnancy, unspecified trimester: Secondary | ICD-10-CM

## 2018-03-23 DIAGNOSIS — N200 Calculus of kidney: Secondary | ICD-10-CM | POA: Insufficient documentation

## 2018-03-23 LAB — CBC WITH DIFFERENTIAL/PLATELET
Basophils Absolute: 0 10*3/uL (ref 0.0–0.1)
Basophils Relative: 0 %
EOS ABS: 0 10*3/uL (ref 0.0–0.5)
Eosinophils Relative: 1 %
HCT: 36.3 % (ref 36.0–46.0)
HEMOGLOBIN: 12.1 g/dL (ref 12.0–15.0)
Lymphocytes Relative: 22 %
Lymphs Abs: 1.8 10*3/uL (ref 0.7–4.0)
MCH: 31.4 pg (ref 26.0–34.0)
MCHC: 33.3 g/dL (ref 30.0–36.0)
MCV: 94.3 fL (ref 80.0–100.0)
MONO ABS: 0.3 10*3/uL (ref 0.1–1.0)
MONOS PCT: 3 %
NEUTROS PCT: 74 %
Neutro Abs: 6 10*3/uL (ref 1.7–7.7)
Platelets: 157 10*3/uL (ref 150–400)
RBC: 3.85 MIL/uL — AB (ref 3.87–5.11)
RDW: 12.5 % (ref 11.5–15.5)
WBC: 8.1 10*3/uL (ref 4.0–10.5)
nRBC: 0 % (ref 0.0–0.2)

## 2018-03-23 LAB — TYPE AND SCREEN
ABO/RH(D): O POS
Antibody Screen: NEGATIVE

## 2018-03-23 LAB — URINALYSIS, ROUTINE W REFLEX MICROSCOPIC
Bilirubin Urine: NEGATIVE
GLUCOSE, UA: NEGATIVE mg/dL
KETONES UR: NEGATIVE mg/dL
Nitrite: NEGATIVE
Protein, ur: NEGATIVE mg/dL
Specific Gravity, Urine: 1.001 — ABNORMAL LOW (ref 1.005–1.030)
pH: 7 (ref 5.0–8.0)

## 2018-03-23 MED ORDER — OXYCODONE-ACETAMINOPHEN 5-325 MG PO TABS: 1.0000 | ORAL_TABLET | Freq: Four times a day (QID) | ORAL | 0 refills | Status: DC | PRN

## 2018-03-23 MED ORDER — HYDROMORPHONE HCL 1 MG/ML IJ SOLN: 1.0000 mg | Freq: Once | INTRAMUSCULAR | Status: DC

## 2018-03-23 MED ORDER — HYDROMORPHONE HCL 1 MG/ML IJ SOLN
1.0000 mg | Freq: Once | INTRAMUSCULAR | Status: AC
  Administered 2018-03-23: 1 mg via INTRAVENOUS
  Filled 2018-03-23: qty 1

## 2018-03-23 MED ORDER — PROMETHAZINE HCL 25 MG PO TABS: 12.5000 mg | ORAL_TABLET | Freq: Four times a day (QID) | ORAL | 0 refills | Status: DC | PRN

## 2018-03-23 MED ORDER — LACTATED RINGERS IV BOLUS
1000.0000 mL | Freq: Once | INTRAVENOUS | Status: AC
  Administered 2018-03-23: 1000 mL via INTRAVENOUS

## 2018-03-23 MED ORDER — MORPHINE SULFATE (PF) 4 MG/ML IV SOLN
4.0000 mg | Freq: Once | INTRAVENOUS | Status: AC
  Administered 2018-03-23: 4 mg via INTRAVENOUS
  Filled 2018-03-23: qty 1

## 2018-03-23 NOTE — MAU Note (Signed)
Samantha [name] is a 32 y.o. at [redacted]w[redacted]d here in MAU reporting: +right sided abdominal pain. Intermittent. Sore and sharp. Expressed difficulty even sitting. Onset of complaint: ongoing for the past week. Pain score: 10/10. Has not taken any medication for the pain. Vitals:   03/23/18 0824  BP: 121/83  Pulse: 92  Resp: 17  SpO2: 99%    +FM Lab orders placed from triage: ua [employer] interpretor used #[id number] Urdu

## 2018-03-23 NOTE — MAU Note (Signed)
Urine in lab 

## 2018-03-23 NOTE — MAU Provider Note (Addendum)
Chief Complaint:  Abdominal Pain   First Provider Initiated Contact with Patient 03/23/18 0847      HPI: Samantha [name] is a 32 y.o. G3P2002 at [redacted]w[redacted]d with hx C/S x 2 then successful VBAC who presents to maternity admissions reporting RLQ abdominal pain x 1 week with significant worsening waking her up at 5 am this morning.  The pain is intermittent, sharp pain, nothing makes it better. She has not tried medications but has tried changing positions and heat.  She is rocking with pain in MAU, unable to find comfortable position.  She reports normal fetal movement and denies any other associated symptoms.  She had bowel movement yesterday and no urinary difficulties.  She does report urinary frequency but reports this is unchanged in her pregnancy.  Urdu language interpreter used for all communication.   HPI  Past Medical History: Past Medical History:  Diagnosis Date  . Migraine     Past obstetric history: OB History  Gravida Para Term Preterm AB Living  3 2 2     2   SAB TAB Ectopic Multiple Live Births        0 2    # Outcome Date GA Lbr Len/2nd Weight Sex Delivery Anes PTL Lv  3 Current           2 Term 11/19/14 [redacted]w[redacted]d 23:05 / 04:23 3005 g M VBAC EPI  LIV     Birth Comments: WNL   1 Term 05/16/11 [redacted]w[redacted]d   F CS-Unspec  N LIV     Complications: Fetal Intolerance, Nuchal cord    Past Surgical History: Past Surgical History:  Procedure Laterality Date  . CESAREAN SECTION      Family History: Family History  Problem Relation Age of Onset  . Asthma Father   . Migraines Mother     Social History: Social History   Tobacco Use  . Smoking status: Never Smoker  . Smokeless tobacco: Never Used  Substance Use Topics  . Alcohol use: No  . Drug use: No    Allergies: No Known Allergies  Meds:  Medications Prior to Admission  Medication Sig Dispense Refill Last Dose  . acetaminophen (TYLENOL) 650 MG CR tablet Take 650 mg by mouth every 8 (eight) hours as needed for pain.    Taking  . butalbital-acetaminophen-caffeine (FIORICET, ESGIC) 50-325-40 MG tablet Take 1-2 tablets by mouth every 6 (six) hours as needed for headache. 20 tablet 0 Taking  . butalbital-acetaminophen-caffeine (FIORICET/CODEINE) 50-325-40-30 MG capsule Take 1 capsule by mouth every 6 (six) hours as needed for headache or migraine. 30 capsule 0 Taking  . cyclobenzaprine (FLEXERIL) 10 MG tablet Take 1 tablet (10 mg total) by mouth every 8 (eight) hours as needed for muscle spasms. 30 tablet 0 Taking  . Elastic Bandages & Supports (COMFORT FIT MATERNITY SUPP LG) MISC 1 each by Does not apply route daily as needed. 1 each 0 Taking  . famotidine (PEPCID AC) 10 MG chewable tablet Chew 2 tablets (20 mg total) by mouth 2 (two) times daily as needed for heartburn. 60 tablet 3 Taking  . metoCLOPramide (REGLAN) 10 MG tablet Take 1 tablet (10 mg total) by mouth 3 (three) times daily with meals. 90 tablet 2 Taking  . ondansetron (ZOFRAN ODT) 8 MG disintegrating tablet Take 1 tablet (8 mg total) by mouth every 8 (eight) hours as needed for nausea or vomiting. 20 tablet 2 Taking  . penicillin v potassium (VEETID) 500 MG tablet Take 1 tablet (500 mg total) by  mouth 2 (two) times daily. (Patient not taking: Reported on 02/04/2018) 20 tablet 0 Not Taking  . Prenatal Multivit-Min-Fe-FA (PRENATAL VITAMINS) 0.8 MG tablet Take 1 tablet by mouth daily. 30 tablet 12 Taking  . prenatal vitamin w/FE, FA (PRENATAL 1 + 1) 27-1 MG TABS tablet Take 1 tablet by mouth daily at 12 noon.   Taking  . ranitidine (ZANTAC) 150 MG tablet Take 1 tablet (150 mg total) by mouth 2 (two) times daily. 60 tablet 1 Taking    ROS:  Review of Systems  Constitutional: Negative for chills, fatigue and fever.  Eyes: Negative for visual disturbance.  Respiratory: Negative for shortness of breath.   Cardiovascular: Negative for chest pain.  Gastrointestinal: Negative for abdominal pain, nausea and vomiting.  Genitourinary: Positive for pelvic pain.  Negative for difficulty urinating, dysuria, flank pain, vaginal bleeding, vaginal discharge and vaginal pain.  Musculoskeletal: Negative for back pain.  Neurological: Negative for dizziness and headaches.  Psychiatric/Behavioral: Negative.      I have reviewed patient's Past Medical Hx, Surgical Hx, Family Hx, Social Hx, medications and allergies.   Physical Exam   Patient Vitals for the past 24 hrs:  BP Temp Pulse Resp SpO2 Weight  03/23/18 0830 - (!) 97.5 F (36.4 C) - - - -  03/23/18 0824 121/83 - 92 17 99 % 73 kg   Constitutional: Well-developed, well-nourished female in no acute distress.  Cardiovascular: normal rate Respiratory: normal effort GI: Abd soft, tender in RLQ with rebound tenderness. No guarding.  gravid appropriate for gestational age.  MS: Extremities nontender, no edema, normal ROM Neurologic: Alert and oriented x 4.  GU: Neg CVAT.  PELVIC EXAM: Cervix pink, visually closed, without lesion, scant white creamy discharge, vaginal walls and external genitalia normal Bimanual exam: Cervix 0/long/high, firm, anterior, neg CMT, uterus nontender, nonenlarged, adnexa without tenderness, enlargement, or mass     FHT:  Baseline 110, moderate variability, accelerations present, no decelerations Contractions: occasional, with some irritability   Labs: Results for orders placed or performed during the hospital encounter of 03/23/18 (from the past 24 hour(s))  Urinalysis, Routine w reflex microscopic     Status: Abnormal   Collection Time: 03/23/18  8:24 AM  Result Value Ref Range   Color, Urine YELLOW YELLOW   APPearance CLOUDY (A) CLEAR   Specific Gravity, Urine 1.001 (L) 1.005 - 1.030   pH 7.0 5.0 - 8.0   Glucose, UA NEGATIVE NEGATIVE mg/dL   Hgb urine dipstick SMALL (A) NEGATIVE   Bilirubin Urine NEGATIVE NEGATIVE   Ketones, ur NEGATIVE NEGATIVE mg/dL   Protein, ur NEGATIVE NEGATIVE mg/dL   Nitrite NEGATIVE NEGATIVE   Leukocytes, UA LARGE (A) NEGATIVE   RBC  / HPF 6-10 0 - 5 RBC/hpf   WBC, UA >50 (H) 0 - 5 WBC/hpf   Bacteria, UA RARE (A) NONE SEEN   Squamous Epithelial / LPF 11-20 0 - 5  CBC with Differential/Platelet     Status: Abnormal   Collection Time: 03/23/18  9:19 AM  Result Value Ref Range   WBC 8.1 4.0 - 10.5 K/uL   RBC 3.85 (L) 3.87 - 5.11 MIL/uL   Hemoglobin 12.1 12.0 - 15.0 g/dL   HCT 16.1 09.6 - 04.5 %   MCV 94.3 80.0 - 100.0 fL   MCH 31.4 26.0 - 34.0 pg   MCHC 33.3 30.0 - 36.0 g/dL   RDW 40.9 81.1 - 91.4 %   Platelets 157 150 - 400 K/uL   nRBC 0.0 0.0 -  0.2 %   Neutrophils Relative % 74 %   Neutro Abs 6.0 1.7 - 7.7 K/uL   Lymphocytes Relative 22 %   Lymphs Abs 1.8 0.7 - 4.0 K/uL   Monocytes Relative 3 %   Monocytes Absolute 0.3 0.1 - 1.0 K/uL   Eosinophils Relative 1 %   Eosinophils Absolute 0.0 0.0 - 0.5 K/uL   Basophils Relative 0 %   Basophils Absolute 0.0 0.0 - 0.1 K/uL  Type and screen     Status: None (Preliminary result)   Collection Time: 03/23/18  9:19 AM  Result Value Ref Range   ABO/RH(D) O POS    Antibody Screen PENDING    Sample Expiration      03/26/2018 Performed at [facility], [address], [place], [place] [address]    --/--/O POS (11/09 0919)  Imaging:   US Renal  Result Date: 03/23/2018 CLINICAL DATA:  Acute right flank pain.  Thirty-nine weeks pregnant. EXAM: RENAL / URINARY TRACT ULTRASOUND COMPLETE COMPARISON:  CT, 11/20/2015 FINDINGS: Right Kidney: Renal measurements: 12.2 x 4.4 x 5.7 cm = volume: 161 mL. Normal parenchymal echogenicity. Mild hydronephrosis with dilation of the proximal right ureter. No mass or intrarenal stone. Left Kidney: Renal measurements: 11.0 x 4.6 x 4.1 cm = volume: 109 mL. Normal parenchymal echogenicity. No mass, stone or hydronephrosis. Bladder: No wall thickening, mass or stone. 6 mm stone suggested in the distal right ureter 2-3 cm from the ureterovesicular junction. IMPRESSION: 1. Findings consistent with a distal right ureteral stone causing mild  right obstructive uropathy with mild right hydronephrosis. Electronically Signed   By: Amie Portland M.D.   On: 03/23/2018 12:10   MAU Course/MDM: CBC, type and screen, UA NST reviewed and reactive but baseline 110 UA with evidence of UTI, with large leukocytes but not clean catch, small hemoglobin noted No evidence of labor with cervix 2 cm, no regular contractions Morphine 4 mg IV given and Lr x 1000 ml Pt without improvement in pain in 1 hour Dilaudid 1 mg IV given RLQ pain with rebound tenderness but no elevated WBCs. Consult Dr Shawnie Pons with presentation, exam findings and test results.  Renal US ordered but considering MRI to look at other sources for pain [place] with evidence of right sided stone and hydronephrosis Additional IV pain medication given but pt initially without improvement Urology consulted and Dr Ronne Binning recommended admission for pain management or home with pain medication depending on pt preference with outpatient follow up with Urology Pt reports improvement with IV pain medication so prefers discharge with pain management at home D/C home with Percocet 5/325, take 1-2 Q 6 hours PRN x 20 tabs Urology f/u this week Strain urine, increase PO fluids Labor precautions reviewed Pt discharge with strict return precautions.   Assessment:  1. Renal calculus, right   2. Acute right flank pain   3. [redacted] weeks gestation of pregnancy   4. History of cesarean section   5. Supervision of other normal pregnancy, antepartum     Plan: Discharge home Labor precautions and fetal kick counts   Allergies as of 03/23/2018   No Known Allergies     Medication List    STOP taking these medications   acetaminophen 650 MG CR tablet Commonly known as:  TYLENOL   butalbital-acetaminophen-caffeine 50-325-40 MG tablet Commonly known as:  FIORICET, ESGIC   butalbital-acetaminophen-caffeine 50-325-40-30 MG capsule Commonly known as:  FIORICET WITH CODEINE   ondansetron 8 MG  disintegrating tablet Commonly known as:  ZOFRAN-ODT  penicillin v potassium 500 MG tablet Commonly known as:  VEETID   ranitidine 150 MG tablet Commonly known as:  ZANTAC     TAKE these medications   famotidine 10 MG chewable tablet Commonly known as:  PEPCID AC Chew 2 tablets (20 mg total) by mouth 2 (two) times daily as needed for heartburn.   metoCLOPramide 10 MG tablet Commonly known as:  REGLAN Take 1 tablet (10 mg total) by mouth 3 (three) times daily with meals.   oxyCODONE-acetaminophen 5-325 MG tablet Commonly known as:  PERCOCET/ROXICET Take 1-2 tablets by mouth every 6 (six) hours as needed for severe pain.   promethazine 25 MG tablet Commonly known as:  PHENERGAN Take 0.5-1 tablets (12.5-25 mg total) by mouth every 6 (six) hours as needed for nausea.     ASK your doctor about these medications   COMFORT FIT MATERNITY SUPP LG Misc 1 each by Does not apply route daily as needed.   cyclobenzaprine 10 MG tablet Commonly known as:  FLEXERIL Take 1 tablet (10 mg total) by mouth every 8 (eight) hours as needed for muscle spasms.   prenatal vitamin w/FE, FA 27-1 MG Tabs tablet Take 1 tablet by mouth daily at 12 noon.   Prenatal Vitamins 0.8 MG tablet Take 1 tablet by mouth daily.       Sharen Counter Certified Nurse-Midwife 03/23/2018 10:11 AM

## 2018-03-25 ENCOUNTER — Ambulatory Visit (INDEPENDENT_AMBULATORY_CARE_PROVIDER_SITE_OTHER): Payer: Medicaid Other | Admitting: Family Medicine

## 2018-03-25 VITALS — BP 114/87 | HR 89 | Wt 157.4 lb

## 2018-03-25 DIAGNOSIS — Z3A39 39 weeks gestation of pregnancy: Secondary | ICD-10-CM

## 2018-03-25 DIAGNOSIS — N390 Urinary tract infection, site not specified: Secondary | ICD-10-CM

## 2018-03-25 DIAGNOSIS — Z3480 Encounter for supervision of other normal pregnancy, unspecified trimester: Secondary | ICD-10-CM

## 2018-03-25 DIAGNOSIS — O34219 Maternal care for unspecified type scar from previous cesarean delivery: Secondary | ICD-10-CM

## 2018-03-25 DIAGNOSIS — Z98891 History of uterine scar from previous surgery: Secondary | ICD-10-CM

## 2018-03-25 MED ORDER — CEPHALEXIN 500 MG PO CAPS: 500.0000 mg | ORAL_CAPSULE | Freq: Four times a day (QID) | ORAL | 0 refills | Status: AC

## 2018-03-25 NOTE — Progress Notes (Signed)
   PRENATAL VISIT NOTE *In-person Urdu interpreter used for visit* Subjective:  Samantha [name] is a 32 y.o. G3P2002 at [redacted]w[redacted]d being seen today for ongoing prenatal care.  She is currently monitored for the following issues for this high-risk pregnancy and has History of VBAC; Migraine without aura and without status migrainosus, not intractable; Supervision of other normal pregnancy, antepartum; History of cesarean section; Language barrier; Back pain affecting pregnancy in third trimester; Indication for care in labor or delivery; and Ureteral stone on their problem list.  - kidney stone - medications make sleepy (pain meds) - still some back pain and discomfort with urination - doesn't want membrane sweep    Contractions: Irritability. Vag. Bleeding: None.  Movement: Present. Denies leaking of fluid.   The following portions of the patient's history were reviewed and updated as appropriate: allergies, current medications, past family history, past medical history, past social history, past surgical history and problem list. Problem list updated.  Objective:   Vitals:   03/25/18 1359  BP: 114/87  Pulse: 89  Weight: 157 lb 6.4 oz (71.4 kg)   Fetal Status: Fetal Heart Rate (bpm): 138 Fundal Height: 40 cm Movement: Present     General:  Alert, oriented and cooperative. Patient is in no acute distress.  Skin: Skin is warm and dry. No rash noted.   Cardiovascular: Normal heart rate noted  Respiratory: Normal respiratory effort, no problems with respiration noted  Abdomen: Soft, gravid, appropriate for gestational age.  Pain/Pressure: Present     Pelvic: Cervical exam deferred       No CVA tenderness   Extremities: Normal range of motion.  Edema: Trace  Mental Status: Normal mood and affect. Normal behavior. Normal judgment and thought content.   Assessment and Plan:  Pregnancy: G3P2002 at [redacted]w[redacted]d  1. Supervision of other normal pregnancy, antepartum -- prenatal record reviewed and  up-to-date -- GBS(-) -- declined membrane sweep today   2. History of VBAC -- consent completed  3. Ureteral Stone: Symptomatic with repeat U/A concerning for UTI, not yet treated. Afebrile. No evidence of passage of stone -- Rx for cephalexin x 7 days -- consider repeat BMP if persistent symptoms -- f/u urine culture   Term labor symptoms and general obstetric precautions including but not limited to vaginal bleeding, contractions, leaking of fluid and fetal movement were reviewed in detail with the patient. Please refer to After Visit Summary for other counseling recommendations.   Tamera Stands, DO

## 2018-03-25 NOTE — Progress Notes (Signed)
Health visitor on [place]- ID# [phone]

## 2018-03-28 ENCOUNTER — Telehealth (HOSPITAL_COMMUNITY): Payer: Self-pay | Admitting: *Deleted

## 2018-03-28 NOTE — Telephone Encounter (Signed)
Preadmission screen interpreter number [phone]

## 2018-03-29 ENCOUNTER — Other Ambulatory Visit: Payer: Self-pay

## 2018-03-29 ENCOUNTER — Inpatient Hospital Stay (HOSPITAL_COMMUNITY): Payer: Medicaid Other | Admitting: Anesthesiology

## 2018-03-29 ENCOUNTER — Inpatient Hospital Stay (EMERGENCY_DEPARTMENT_HOSPITAL)
Admission: AD | Admit: 2018-03-29 | Discharge: 2018-03-29 | Disposition: A | Discharge status: Home / Self Care | Payer: Medicaid Other | Source: Ambulatory Visit | Attending: Obstetrics & Gynecology | Admitting: Obstetrics & Gynecology

## 2018-03-29 ENCOUNTER — Encounter (HOSPITAL_COMMUNITY): Payer: Self-pay | Admitting: *Deleted

## 2018-03-29 ENCOUNTER — Inpatient Hospital Stay (HOSPITAL_COMMUNITY)
Admission: AD | Admit: 2018-03-29 | Discharge: 2018-04-01 | DRG: 806 | Disposition: A | Discharge status: Home / Self Care | Payer: Medicaid Other | Attending: Obstetrics & Gynecology | Admitting: Obstetrics & Gynecology

## 2018-03-29 DIAGNOSIS — R12 Heartburn: Secondary | ICD-10-CM | POA: Diagnosis present

## 2018-03-29 DIAGNOSIS — Z98891 History of uterine scar from previous surgery: Secondary | ICD-10-CM

## 2018-03-29 DIAGNOSIS — M549 Dorsalgia, unspecified: Secondary | ICD-10-CM | POA: Insufficient documentation

## 2018-03-29 DIAGNOSIS — O34219 Maternal care for unspecified type scar from previous cesarean delivery: Secondary | ICD-10-CM | POA: Diagnosis present

## 2018-03-29 DIAGNOSIS — O480 Post-term pregnancy: Secondary | ICD-10-CM | POA: Diagnosis not present

## 2018-03-29 DIAGNOSIS — O479 False labor, unspecified: Secondary | ICD-10-CM

## 2018-03-29 DIAGNOSIS — Z3483 Encounter for supervision of other normal pregnancy, third trimester: Secondary | ICD-10-CM | POA: Diagnosis present

## 2018-03-29 DIAGNOSIS — K5900 Constipation, unspecified: Secondary | ICD-10-CM | POA: Diagnosis not present

## 2018-03-29 DIAGNOSIS — Z758 Other problems related to medical facilities and other health care: Secondary | ICD-10-CM | POA: Diagnosis present

## 2018-03-29 DIAGNOSIS — N201 Calculus of ureter: Secondary | ICD-10-CM

## 2018-03-29 DIAGNOSIS — O9989 Other specified diseases and conditions complicating pregnancy, childbirth and the puerperium: Principal | ICD-10-CM

## 2018-03-29 DIAGNOSIS — Z3480 Encounter for supervision of other normal pregnancy, unspecified trimester: Secondary | ICD-10-CM

## 2018-03-29 DIAGNOSIS — G43009 Migraine without aura, not intractable, without status migrainosus: Secondary | ICD-10-CM | POA: Diagnosis present

## 2018-03-29 DIAGNOSIS — N200 Calculus of kidney: Secondary | ICD-10-CM | POA: Diagnosis present

## 2018-03-29 DIAGNOSIS — O26893 Other specified pregnancy related conditions, third trimester: Secondary | ICD-10-CM | POA: Insufficient documentation

## 2018-03-29 DIAGNOSIS — O26833 Pregnancy related renal disease, third trimester: Secondary | ICD-10-CM | POA: Diagnosis present

## 2018-03-29 DIAGNOSIS — Z3A40 40 weeks gestation of pregnancy: Secondary | ICD-10-CM

## 2018-03-29 DIAGNOSIS — O471 False labor at or after 37 completed weeks of gestation: Secondary | ICD-10-CM | POA: Insufficient documentation

## 2018-03-29 DIAGNOSIS — O99891 Other specified diseases and conditions complicating pregnancy: Secondary | ICD-10-CM

## 2018-03-29 DIAGNOSIS — O9962 Diseases of the digestive system complicating childbirth: Principal | ICD-10-CM | POA: Diagnosis present

## 2018-03-29 DIAGNOSIS — Z789 Other specified health status: Secondary | ICD-10-CM | POA: Diagnosis present

## 2018-03-29 LAB — CBC
HEMATOCRIT: 37.4 % (ref 36.0–46.0)
HEMOGLOBIN: 12.4 g/dL (ref 12.0–15.0)
MCH: 31.4 pg (ref 26.0–34.0)
MCHC: 33.2 g/dL (ref 30.0–36.0)
MCV: 94.7 fL (ref 80.0–100.0)
Platelets: 169 10*3/uL (ref 150–400)
RBC: 3.95 MIL/uL (ref 3.87–5.11)
RDW: 12.5 % (ref 11.5–15.5)
WBC: 8.3 10*3/uL (ref 4.0–10.5)
nRBC: 0 % (ref 0.0–0.2)

## 2018-03-29 LAB — TYPE AND SCREEN
ABO/RH(D): O POS
Antibody Screen: NEGATIVE

## 2018-03-29 LAB — WET PREP, GENITAL
Clue Cells Wet Prep HPF POC: NONE SEEN
Sperm: NONE SEEN
Trich, Wet Prep: NONE SEEN
Yeast Wet Prep HPF POC: NONE SEEN

## 2018-03-29 LAB — POCT FERN TEST
POCT FERN TEST: NEGATIVE
POCT Fern Test: POSITIVE

## 2018-03-29 MED ORDER — EPHEDRINE 5 MG/ML INJ
10.0000 mg | INTRAVENOUS | Status: DC | PRN
  Filled 2018-03-29: qty 2

## 2018-03-29 MED ORDER — LIDOCAINE HCL (PF) 1 % IJ SOLN
30.0000 mL | INTRAMUSCULAR | Status: DC | PRN
  Filled 2018-03-29: qty 30

## 2018-03-29 MED ORDER — OXYTOCIN BOLUS FROM INFUSION
500.0000 mL | Freq: Once | INTRAVENOUS | Status: AC
  Administered 2018-03-30: 500 mL via INTRAVENOUS

## 2018-03-29 MED ORDER — FENTANYL 2.5 MCG/ML BUPIVACAINE 1/10 % EPIDURAL INFUSION (WH - ANES)
INTRAMUSCULAR | Status: AC
  Filled 2018-03-29: qty 100

## 2018-03-29 MED ORDER — ACETAMINOPHEN 325 MG PO TABS
650.0000 mg | ORAL_TABLET | ORAL | Status: DC | PRN
  Administered 2018-03-30: 650 mg via ORAL
  Filled 2018-03-29: qty 2

## 2018-03-29 MED ORDER — DIPHENHYDRAMINE HCL 50 MG/ML IJ SOLN: 12.5000 mg | INTRAMUSCULAR | Status: DC | PRN

## 2018-03-29 MED ORDER — OXYCODONE-ACETAMINOPHEN 5-325 MG PO TABS: 1.0000 | ORAL_TABLET | ORAL | Status: DC | PRN

## 2018-03-29 MED ORDER — PHENYLEPHRINE 40 MCG/ML (10ML) SYRINGE FOR IV PUSH (FOR BLOOD PRESSURE SUPPORT)
PREFILLED_SYRINGE | INTRAVENOUS | Status: AC
  Filled 2018-03-29: qty 10

## 2018-03-29 MED ORDER — OXYCODONE-ACETAMINOPHEN 5-325 MG PO TABS: 1.0000 | ORAL_TABLET | Freq: Four times a day (QID) | ORAL | 0 refills | Status: DC | PRN

## 2018-03-29 MED ORDER — LACTATED RINGERS IV SOLN: 500.0000 mL | Freq: Once | INTRAVENOUS | Status: DC

## 2018-03-29 MED ORDER — LACTATED RINGERS IV SOLN
INTRAVENOUS | Status: DC
  Administered 2018-03-30: via INTRAVENOUS

## 2018-03-29 MED ORDER — LIDOCAINE-EPINEPHRINE (PF) 2 %-1:200000 IJ SOLN
INTRAMUSCULAR | Status: DC | PRN
  Administered 2018-03-29: 3 mL via EPIDURAL
  Administered 2018-03-29: 4 mL via EPIDURAL

## 2018-03-29 MED ORDER — PHENYLEPHRINE 40 MCG/ML (10ML) SYRINGE FOR IV PUSH (FOR BLOOD PRESSURE SUPPORT)
80.0000 ug | PREFILLED_SYRINGE | INTRAVENOUS | Status: DC | PRN
  Filled 2018-03-29: qty 5

## 2018-03-29 MED ORDER — OXYCODONE-ACETAMINOPHEN 5-325 MG PO TABS: 2.0000 | ORAL_TABLET | ORAL | Status: DC | PRN

## 2018-03-29 MED ORDER — LACTATED RINGERS IV SOLN: 500.0000 mL | INTRAVENOUS | Status: DC | PRN

## 2018-03-29 MED ORDER — FENTANYL 2.5 MCG/ML BUPIVACAINE 1/10 % EPIDURAL INFUSION (WH - ANES)
14.0000 mL/h | INTRAMUSCULAR | Status: DC | PRN
  Administered 2018-03-29 – 2018-03-30 (×2): 14 mL/h via EPIDURAL
  Filled 2018-03-29: qty 100

## 2018-03-29 MED ORDER — OXYCODONE HCL 5 MG PO TABS
5.0000 mg | ORAL_TABLET | Freq: Once | ORAL | Status: AC
  Administered 2018-03-29: 5 mg via ORAL
  Filled 2018-03-29: qty 1

## 2018-03-29 MED ORDER — FLEET ENEMA 7-19 GM/118ML RE ENEM: 1.0000 | ENEMA | RECTAL | Status: DC | PRN

## 2018-03-29 MED ORDER — SOD CITRATE-CITRIC ACID 500-334 MG/5ML PO SOLN: 30.0000 mL | ORAL | Status: DC | PRN

## 2018-03-29 MED ORDER — OXYTOCIN 40 UNITS IN LACTATED RINGERS INFUSION - SIMPLE MED
2.5000 [IU]/h | INTRAVENOUS | Status: DC
  Administered 2018-03-30: 2.5 [IU]/h via INTRAVENOUS
  Filled 2018-03-29: qty 1000

## 2018-03-29 MED ORDER — ONDANSETRON HCL 4 MG/2ML IJ SOLN
4.0000 mg | Freq: Four times a day (QID) | INTRAMUSCULAR | Status: DC | PRN
  Administered 2018-03-30: 4 mg via INTRAVENOUS
  Filled 2018-03-29: qty 2

## 2018-03-29 NOTE — Anesthesia Procedure Notes (Signed)
Epidural Patient location during procedure: OB Start time: 03/29/2018 10:05 PM End time: 03/29/2018 10:20 PM  Staffing Anesthesiologist: Elmer PickerWoodrum, Henrick Mcgue L, MD Performed: anesthesiologist   Preanesthetic Checklist Completed: patient identified, pre-op evaluation, timeout performed, IV checked, risks and benefits discussed and monitors and equipment checked  Epidural Patient position: sitting Prep: site prepped and draped and DuraPrep Patient monitoring: continuous pulse ox, blood pressure, heart rate and cardiac monitor Approach: midline Location: L3-L4 Injection technique: LOR air  Needle:  Needle type: Tuohy  Needle gauge: 17 G Needle length: 9 cm Needle insertion depth: 5 cm Catheter type: closed end flexible Catheter size: 19 Gauge Catheter at skin depth: 10 cm Test dose: negative  Assessment Sensory level: T8 Events: blood not aspirated, injection not painful, no injection resistance, negative IV test and no paresthesia  Additional Notes Patient identified. Risks/Benefits/Options discussed with patient including but not limited to bleeding, infection, nerve damage, paralysis, failed block, incomplete pain control, headache, blood pressure changes, nausea, vomiting, reactions to medication both or allergic, itching and postpartum back pain. Confirmed with bedside nurse the patient's most recent platelet count. Confirmed with patient that they are not currently taking any anticoagulation, have any bleeding history or any family history of bleeding disorders. Patient expressed understanding and wished to proceed. All questions were answered. Sterile technique was used throughout the entire procedure. Please see nursing notes for vital signs. Test dose was given through epidural catheter and negative prior to continuing to dose epidural or start infusion. Warning signs of high block given to the patient including shortness of breath, tingling/numbness in hands, complete motor  block, or any concerning symptoms with instructions to call for help. Patient was given instructions on fall risk and not to get out of bed. All questions and concerns addressed with instructions to call with any issues or inadequate analgesia.  Reason for block:procedure for pain

## 2018-03-29 NOTE — Discharge Instructions (Signed)

## 2018-03-29 NOTE — MAU Note (Signed)
Pt reports ROM at 0700, contractions

## 2018-03-29 NOTE — MAU Provider Note (Signed)
S: Ms. Angela BurkeSehrish Dittmar is a 32 y.o. G3P2002 at 711w0d  who presents to MAU today complaining contractions q 5  minutes since 7 am. She denies vaginal bleeding. She endorses LOF. She reports normal fetal movement.    O: BP 127/83 (BP Location: Left Arm)   Pulse 85   Temp 98.6 F (37 C) (Oral)   Resp 19   LMP 06/22/2017   SpO2 99%  GENERAL: Well-developed, well-nourished female in no acute distress.  HEAD: Normocephalic, atraumatic.  CHEST: Normal effort of breathing, regular heart rate ABDOMEN: Soft, nontender, gravid  Cervical exam:  Dilation: 3 Exam by:: ,CNM  Fetal Monitoring: Baseline: 120 Variability: mod Accelerations: present Decelerations: negative Contractions: q 3 minutes   A: SIUP at 581w0d  False labor Patient has had 3 cervical exams in 6 hours; has changed from 2 cm to 3 cm and has not changed.   P: Discharge home with labor precautions, keep appt on Monday.  -Plan of care discussed with Dr. Debroah LoopArnold, who agrees.  -Patient has back pain; will give percocet to take at home.  -Reviewed warning signs and when to return to MAU.  -Patient's husband verbalized understanding.   Marylene Land,  [place], CNM 03/29/2018 2:57 PM

## 2018-03-29 NOTE — Anesthesia Preprocedure Evaluation (Signed)
Anesthesia Evaluation  Patient identified by MRN, date of birth, ID band Patient awake    Reviewed: Allergy & Precautions, NPO status , Patient's Chart, lab work & pertinent test results  Airway Mallampati: I  TM Distance: >3 FB Neck ROM: Full    Dental no notable dental hx. (+) Teeth Intact   Pulmonary neg pulmonary ROS,    Pulmonary exam normal breath sounds clear to auscultation       Cardiovascular negative cardio ROS Normal cardiovascular exam Rhythm:Regular Rate:Normal     Neuro/Psych  Headaches, negative psych ROS   GI/Hepatic negative GI ROS, Neg liver ROS,   Endo/Other  negative endocrine ROS  Renal/GU negative Renal ROS  negative genitourinary   Musculoskeletal negative musculoskeletal ROS (+)   Abdominal   Peds  Hematology negative hematology ROS (+)   Anesthesia Other Findings   Reproductive/Obstetrics (+) Pregnancy                             Anesthesia Physical Anesthesia Plan  ASA: II  Anesthesia Plan: Epidural   Post-op Pain Management:    Induction:   PONV Risk Score and Plan: Treatment may vary due to age or medical condition  Airway Management Planned: Natural Airway  Additional Equipment:   Intra-op Plan:   Post-operative Plan:   Informed Consent: I have reviewed the patients History and Physical, chart, labs and discussed the procedure including the risks, benefits and alternatives for the proposed anesthesia with the patient or authorized representative who has indicated his/her understanding and acceptance.     Plan Discussed with: Anesthesiologist  Anesthesia Plan Comments: (Patient identified. Risks, benefits, options discussed with patient including but not limited to bleeding, infection, nerve damage, paralysis, failed block, incomplete pain control, headache, blood pressure changes, nausea, vomiting, reactions to medication, itching, and post  partum back pain. Confirmed with bedside nurse the patient's most recent platelet count. Confirmed with the patient that they are not taking any anticoagulation, have any bleeding history or any family history of bleeding disorders. Patient expressed understanding and wishes to proceed. All questions were answered. )        Anesthesia Quick Evaluation

## 2018-03-29 NOTE — MAU Note (Signed)
Pt presents to MAU via EMS c/o possible SROM at 2000 and ctx every . +FM. Bloody Show.

## 2018-03-30 ENCOUNTER — Encounter (HOSPITAL_COMMUNITY): Payer: Self-pay | Admitting: Student

## 2018-03-30 DIAGNOSIS — O480 Post-term pregnancy: Secondary | ICD-10-CM

## 2018-03-30 DIAGNOSIS — Z3A40 40 weeks gestation of pregnancy: Secondary | ICD-10-CM

## 2018-03-30 LAB — RPR: RPR: NONREACTIVE

## 2018-03-30 MED ORDER — DIPHENHYDRAMINE HCL 25 MG PO CAPS: 25.0000 mg | ORAL_CAPSULE | Freq: Four times a day (QID) | ORAL | Status: DC | PRN

## 2018-03-30 MED ORDER — ONDANSETRON HCL 4 MG PO TABS: 4.0000 mg | ORAL_TABLET | ORAL | Status: DC | PRN

## 2018-03-30 MED ORDER — ACETAMINOPHEN 325 MG PO TABS
650.0000 mg | ORAL_TABLET | ORAL | Status: DC | PRN
  Administered 2018-03-30 – 2018-03-31 (×3): 650 mg via ORAL
  Filled 2018-03-30 (×4): qty 2

## 2018-03-30 MED ORDER — WITCH HAZEL-GLYCERIN EX PADS: 1.0000 "application " | MEDICATED_PAD | CUTANEOUS | Status: DC | PRN

## 2018-03-30 MED ORDER — ZOLPIDEM TARTRATE 5 MG PO TABS: 5.0000 mg | ORAL_TABLET | Freq: Every evening | ORAL | Status: DC | PRN

## 2018-03-30 MED ORDER — OXYTOCIN 40 UNITS IN LACTATED RINGERS INFUSION - SIMPLE MED: 1.0000 m[IU]/min | INTRAVENOUS | Status: DC

## 2018-03-30 MED ORDER — IBUPROFEN 600 MG PO TABS
600.0000 mg | ORAL_TABLET | Freq: Four times a day (QID) | ORAL | Status: DC
  Administered 2018-03-30 – 2018-04-01 (×6): 600 mg via ORAL
  Filled 2018-03-30 (×7): qty 1

## 2018-03-30 MED ORDER — TERBUTALINE SULFATE 1 MG/ML IJ SOLN
0.2500 mg | Freq: Once | INTRAMUSCULAR | Status: DC | PRN
  Filled 2018-03-30: qty 1

## 2018-03-30 MED ORDER — SIMETHICONE 80 MG PO CHEW
80.0000 mg | CHEWABLE_TABLET | ORAL | Status: DC | PRN
  Filled 2018-03-30: qty 1

## 2018-03-30 MED ORDER — OXYCODONE HCL 5 MG PO TABS
5.0000 mg | ORAL_TABLET | ORAL | Status: DC | PRN
  Filled 2018-03-30: qty 1

## 2018-03-30 MED ORDER — ONDANSETRON HCL 4 MG/2ML IJ SOLN: 4.0000 mg | INTRAMUSCULAR | Status: DC | PRN

## 2018-03-30 MED ORDER — BENZOCAINE-MENTHOL 20-0.5 % EX AERO
1.0000 "application " | INHALATION_SPRAY | CUTANEOUS | Status: DC | PRN
  Administered 2018-03-30: 1 via TOPICAL
  Filled 2018-03-30: qty 56

## 2018-03-30 MED ORDER — COCONUT OIL OIL: 1.0000 "application " | TOPICAL_OIL | Status: DC | PRN

## 2018-03-30 MED ORDER — PRENATAL MULTIVITAMIN CH
1.0000 | ORAL_TABLET | Freq: Every day | ORAL | Status: DC
  Administered 2018-03-31: 1 via ORAL
  Filled 2018-03-30: qty 1

## 2018-03-30 MED ORDER — DIBUCAINE 1 % RE OINT: 1.0000 "application " | TOPICAL_OINTMENT | RECTAL | Status: DC | PRN

## 2018-03-30 MED ORDER — SENNOSIDES-DOCUSATE SODIUM 8.6-50 MG PO TABS
2.0000 | ORAL_TABLET | ORAL | Status: DC
  Administered 2018-03-30 – 2018-03-31 (×2): 2 via ORAL
  Filled 2018-03-30 (×2): qty 2

## 2018-03-30 MED ORDER — TETANUS-DIPHTH-ACELL PERTUSSIS 5-2.5-18.5 LF-MCG/0.5 IM SUSP: 0.5000 mL | Freq: Once | INTRAMUSCULAR | Status: DC

## 2018-03-30 NOTE — H&P (Addendum)
OB ADMISSION/ HISTORY & PHYSICAL:  Admission Date: 03/29/2018  8:20 PM  Admit Diagnosis: Spontaneous labor  Samantha [name] is a 32 y.o. female G3P2002 @ 40.1wks presenting in spontaneous labor. Pt was seen and discharged earlier today in latent labor. Spouse states ctx increased at 1900 and pt was very uncomfortable. Reports LOF @ 2000.   Prenatal History: [id number]   EDC : 03/29/2018, by Last Menstrual Period  [facility] at [facility] since 11 wks 3 days gestation  Prenatal course complicated by Migraine w/ aura  Prenatal Labs: ABO, Rh: O (04/29 1706)  Antibody: NEG (11/15 2104) Rubella: 3.88 Immune (04/29 1706)  RPR: Non Reactive (09/09 0842)  HBsAg: Negative (04/29 1706)  HIV: Non Reactive (09/09 0842)  GBS:  Negative  Genetic testing: 1 hr Glucola : Failed, 3 hr passed Anat U/S: Normal anatomy, posterior placenta Tdap: 01/21/18 Influenza: 01/21/18   Medical / Surgical History :  Past medical history:  Past Medical History:  Diagnosis Date  . Migraine      Past surgical history:  Past Surgical History:  Procedure Laterality Date  . CESAREAN SECTION       Family History:  Family History  Problem Relation Age of Onset  . Asthma Father   . Migraines Mother      Social History:  reports that she has never smoked. She has never used smokeless tobacco. She reports that she does not drink alcohol or use drugs.   Allergies: Patient has no known allergies.    Current Medications at time of admission:  Medications Prior to Admission  Medication Sig Dispense Refill Last Dose  . cephALEXin (KEFLEX) 500 MG capsule Take 1 capsule (500 mg total) by mouth 4 (four) times daily for 7 days. 28 capsule 0 03/28/2018 at Unknown time  . cyclobenzaprine (FLEXERIL) 10 MG tablet Take 1 tablet (10 mg total) by mouth every 8 (eight) hours as needed for muscle spasms. 30 tablet 0 03/28/2018 at Unknown time  . oxyCODONE-acetaminophen (PERCOCET/ROXICET) 5-325 MG tablet  Take 1-2 tablets by mouth every 6 (six) hours as needed for severe pain. 20 tablet 0 03/29/2018 at Unknown time  . Prenatal Multivit-Min-Fe-FA (PRENATAL VITAMINS) 0.8 MG tablet Take 1 tablet by mouth daily. 30 tablet 12 03/28/2018 at Unknown time  . promethazine (PHENERGAN) 25 MG tablet Take 0.5-1 tablets (12.5-25 mg total) by mouth every 6 (six) hours as needed for nausea. 30 tablet 0 03/29/2018 at Unknown time  . Elastic Bandages & Supports (COMFORT FIT MATERNITY SUPP LG) MISC 1 each by Does not apply route daily as needed. 1 each 0 Taking  . famotidine (PEPCID AC) 10 MG chewable tablet Chew 2 tablets (20 mg total) by mouth 2 (two) times daily as needed for heartburn. 60 tablet 3 03/28/2018 at Unknown time  . prenatal vitamin w/FE, FA (PRENATAL 1 + 1) 27-1 MG TABS tablet Take 1 tablet by mouth daily at 12 noon.   Taking      Review of Systems: Review of Systems  Constitutional: Negative.   HENT: Positive for congestion.   Respiratory: Negative.   Cardiovascular: Negative.   Gastrointestinal: Positive for heartburn.  Genitourinary: Negative.   Neurological: Negative.    Physical Exam:  Vitals:   03/29/18 2222 03/29/18 2226  BP: 114/69 116/72  Pulse: 92 90  Resp:    Temp:    SpO2:     General: A/A/O x2, NAD Heart: RRR Lungs: Clear bilat Abdomen: Soft, gravid Extremities: Trace edema to BLE, neg for  pain, tenderness, and cords Genitalia / VE: Dilation: 4.5 Effacement (%): 70 Station: -2 Presentation: Vertex Exam by:: Mary SwazilandJordan Johnson, RN   FHR: Baseline 125/ moderate variability/ accels present/ decels absent TOCO: 2-4 min, mod via palpation Membranes: SROM EFW: 6.5 lb per Leopold's Labs:    Recent Labs    03/29/18 2104  WBC 8.3  HGB 12.4  HCT 37.4  PLT 169    Assessment:  32 y.o. G3P2002 at 8531w1d  Latent stage of labor  Desires TOLAC     -Prev C/S w/ first baby, successful VBAC w/ 2016 SROM FHR category 1 GBS neg Epidural effective for pain  management   Plan:  Admit to BS Routine L&D orders Expectant management -consider Pitocin augmentation if no cervical change w/ next exam Anticipate NSVB Plans to breastfeed Desires outpt circumcision Undecided on contraception  Rhea PinkVivian Grice, [facility] RanchNM 03/30/2018 12:36 AM   CNMS attestation:  I have seen and examined this patient; I agree with above documentation in the student midwife's note.   [name] is a 32 y.o. G3P3003 here for SROM/latent labor  PE: BP 115/84   Pulse 98   Temp 98.1 F (36.7 C) (Oral)   Resp 16   Ht 5\' 3"  (1.6 m)   LMP 06/22/2017   SpO2 99%   Breastfeeding? Unknown   BMI 27.88 kg/m   Resp: normal effort, no distress Abd: gravid  ROS, labs, PMH reviewed  [facility]: [facility] Expectant management for now; may need Pit augmentation GBS neg Plans for TOLAC Anticipate SVD  Arabella MerlesKimberly D Shaw CNM 03/30/2018, 10:29 AM

## 2018-03-30 NOTE — Anesthesia Postprocedure Evaluation (Signed)
Anesthesia Post Note  Patient: Angela BurkeSehrish Akhavan  Procedure(s) Performed: AN AD HOC LABOR EPIDURAL     Patient location during evaluation: Mother Baby Anesthesia Type: Epidural Level of consciousness: awake and alert and oriented Pain management: satisfactory to patient Vital Signs Assessment: post-procedure vital signs reviewed and stable Respiratory status: respiratory function stable Cardiovascular status: stable Postop Assessment: no headache, no backache, epidural receding, patient able to bend at knees, no signs of nausea or vomiting and adequate PO intake Anesthetic complications: no    Last Vitals:  Vitals:   03/30/18 1102 03/30/18 1144  BP: 100/65 120/80  Pulse: (!) 101 88  Resp: 16 16  Temp:  37 C  SpO2:  99%    Last Pain:  Vitals:   03/30/18 1330  TempSrc:   PainSc: 3    Pain Goal: Patients Stated Pain Goal: 0 (03/29/18 2039)               Elita Dame

## 2018-03-31 MED ORDER — POLYETHYLENE GLYCOL 3350 17 G PO PACK
17.0000 g | PACK | Freq: Every day | ORAL | Status: DC
  Administered 2018-03-31: 17 g via ORAL
  Filled 2018-03-31 (×2): qty 1

## 2018-03-31 NOTE — Progress Notes (Signed)
Called Anesthesia regarding patient stating "Her legs feel weak and are tingling." Call Dr. Sampson GoonFitzgerald to assess.

## 2018-03-31 NOTE — Progress Notes (Signed)
Saw pt at request of mother baby nurse secondary to tingling in legs. Pt states she has tingling sensation in her legs from her thighs to feet. Sensation was not there prior to delivery and is somewhat uncomfortable. States it feels as though her feet are very swollen. Mild edema on exam but no pitting. Pt has been walking the halls unassisted without difficulty and on exam is 5/5 strength in hip flexors, hamstrings, plantar flexion and dorsiflexion. Sensation to light touch is intact. Unlikely this is related to her epidural. Likely uncomfortable swelling post-delivery. Recommend continue with frequent ambulation to mobilize fluid.   Glade Stanford. Anjali Manzella, MD

## 2018-03-31 NOTE — Progress Notes (Addendum)
POSTPARTUM PROGRESS NOTE  Post Partum Day 1  Subjective:  Samantha [name] is a 32 y.o. G3P3003 s/p VBAC at 7057w1d.  No acute events overnight.  Pt denies problems with voiding or po intake. She reports she is moving and ambulating with some assistance due to increased abdominal pain from being constipated. She denies nausea or vomiting.  Pain is moderately controlled.  She has had flatus. She has had bowel movement- reports not having a BM in 5 days.  Lochia Small.   Objective: Blood pressure 110/79, pulse 71, temperature 97.8 F (36.6 C), temperature source Oral, resp. rate 18, height 5\' 3"  (1.6 m), last menstrual period 06/22/2017, SpO2 99 %, unknown if currently breastfeeding.  Physical Exam:  General: alert, cooperative and no distress Chest: no respiratory distress Heart:regular rate, distal pulses intact Abdomen: soft, nontender,  Uterine Fundus: firm, appropriately tender DVT Evaluation: No calf swelling or tenderness Extremities: No edema  Recent Labs    03/29/18 2104  HGB 12.4  HCT 37.4    Assessment/Plan: Samantha [name] is a 32 y.o. G3P3003 s/p VBAC at 2157w1d   PPD#1 - Doing well Contraception: Condoms vs Paragard Feeding: breast and formula  Dispo: Plan for discharge tomorrow. #Constipation: Miralax ordered for no BM in 5 days, possible enema if Miralax does not work   LOS: 2 days   Sharyon CableRogers, Lakeita Panther C, CNM 03/31/2018, 1:11 PM

## 2018-04-01 ENCOUNTER — Other Ambulatory Visit: Payer: Medicaid Other

## 2018-04-01 ENCOUNTER — Encounter: Payer: Medicaid Other | Admitting: Advanced Practice Midwife

## 2018-04-01 DIAGNOSIS — N201 Calculus of ureter: Secondary | ICD-10-CM

## 2018-04-01 LAB — GC/CHLAMYDIA PROBE AMP (~~LOC~~) NOT AT ARMC
Chlamydia: NEGATIVE
Neisseria Gonorrhea: NEGATIVE

## 2018-04-01 MED ORDER — IBUPROFEN 600 MG PO TABS: 600.0000 mg | ORAL_TABLET | Freq: Four times a day (QID) | ORAL | 0 refills | Status: AC

## 2018-04-01 NOTE — Discharge Summary (Signed)
Obstetrics Discharge Summary OB/GYN Faculty Practice   Patient Name: Samantha [name] DOB: 07/17/1985 MRN: [id number]  Date of admission: 03/29/2018 Delivering MD: Rhea Pink B   Date of discharge: 04/01/2018  Admitting diagnosis: LABOR CHECK Intrauterine pregnancy: [redacted]w[redacted]d     Secondary diagnosis:   Active Problems:   History of VBAC   Migraine without aura and without status migrainosus, not intractable   Supervision of other normal pregnancy, antepartum   History of cesarean section   Language barrier   Indication for care in labor or delivery  Additional problems:  . None     Discharge diagnosis: Term Pregnancy Delivered                                            Postpartum procedures: None  Complications: none  Outpatient Follow-Up [ ]  ensure resolution of LBP, UTI - distal right ureteral stone diagnosed 03/23/18 - treated with cephalexin for complicating urinary tract infection   *Patient declined interpreter. Husband acted as interpreter for visit* Hospital course: Samantha [name] is a 32 y.o. [redacted]w[redacted]d who was admitted for spontaneous onset of labor. Her pregnancy was complicated by recently diagnosed kidney stone, urinary tract infection, and above noted items. Her labor course was unremarkable.. Delivery was uncomplicated, successful TOLAC. Please see delivery/op note for additional details. Her postpartum course was uncomplicated. She was breastfeeding without difficulty. By day of discharge, she was passing flatus, urinating, eating and drinking without difficulty. Her pain was well-controlled, and she was discharged home with ibuprofen. She will follow-up in clinic in 4-6 weeks for postpartum visit in in 1-2 weeks for kidney stone and UTI.   Physical exam  Vitals:   03/31/18 0529 03/31/18 1423 03/31/18 2059 04/01/18 0546  BP: 110/79 118/74 124/84 106/86  Pulse: 71 67 77 83  Resp: 18 15 16 18   Temp: 97.8 F (36.6 C) 98.1 F (36.7 C) 98.3 F (36.8 C) 97.8 F (36.6 C)   TempSrc: Oral Oral Oral Oral  SpO2: 99% 98% 99% 99%  Height:       General: well-appearing, NAD Lochia: appropriate Uterine Fundus: firm Incision: N/A DVT Evaluation: No significant calf/ankle edema. Labs: Lab Results  Component Value Date   WBC 8.3 03/29/2018   HGB 12.4 03/29/2018   HCT 37.4 03/29/2018   MCV 94.7 03/29/2018   PLT 169 03/29/2018   CMP Latest Ref Rng & Units 08/21/2017  Glucose 65 - 99 mg/dL 409(W)  BUN 6 - 20 mg/dL 13  Creatinine 1.19 - 1.47 mg/dL 8.29  Sodium 562 - 130 mmol/L 135  Potassium 3.5 - 5.1 mmol/L 3.7  Chloride 101 - 111 mmol/L 107  CO2 22 - 32 mmol/L 20(L)  Calcium 8.9 - 10.3 mg/dL 8.6(V)  Total Protein 6.5 - 8.1 g/dL 7.3  Total Bilirubin 0.3 - 1.2 mg/dL 0.4  Alkaline Phos 38 - 126 U/L 35(L)  AST 15 - 41 U/L 17  ALT 14 - 54 U/L 16   Discharge instructions: Per After Visit Summary and "Baby and Me Booklet"  After visit meds:  Allergies as of 04/01/2018   No Known Allergies     Medication List    STOP taking these medications   promethazine 25 MG tablet Commonly known as:  PHENERGAN     TAKE these medications   cephALEXin 500 MG capsule Commonly known as:  KEFLEX Take 1 capsule (500 mg total) by mouth  4 (four) times daily for 7 days.   COMFORT FIT MATERNITY SUPP LG Misc 1 each by Does not apply route daily as needed.   cyclobenzaprine 10 MG tablet Commonly known as:  FLEXERIL Take 1 tablet (10 mg total) by mouth every 8 (eight) hours as needed for muscle spasms.   ibuprofen 600 MG tablet Commonly known as:  ADVIL,MOTRIN Take 1 tablet (600 mg total) by mouth every 6 (six) hours.   Prenatal Vitamins 0.8 MG tablet Take 1 tablet by mouth daily.      Postpartum contraception: condoms, Paragard Diet: Routine Diet Activity: Advance as tolerated. Pelvic rest for 6 weeks.   Outpatient follow-up:message sent to schedule in 4-6 weeks  Newborn Data: Live born female  Birth Weight: 7 lb 2.3 oz (3240 g) APGAR: 9, 9  Newborn  Delivery   Birth date/time:  03/30/2018 06:48:00 Delivery type:  VBAC, Spontaneous    Baby Feeding: Breast Disposition:home with mother  Cristal DeerLaurel S. Earlene PlaterWallace, DO OB/GYN Fellow, Faculty Practice

## 2018-04-01 NOTE — Lactation Note (Signed)
This note was copied from a baby's chart. Lactation Consultation Note Baby 4843 hrs old. Mom's 2nd child. Mom BF her 1st child for 2 yrs. Mom is breast/formula feeding. FOB interpreter for mom. Communicating well w/LC. Mom states BF going well, a little sore. Denied LC to assess breast at this time. Mom stated it's OK. Denies need for anything to go on her nipples for soreness. Mom stated she is BF first then formula feeding until her milk comes in. Mom denies her breast filling or fullness at this time.  Mom has WIC.  Mom encouraged to feed baby 8-12 times/24 hours and with feeding cues. Educated about newborn behavior, STS, I&O, supply and demand States feeding going well. Needs no assistance at this time. Plans for discharge today. Has no questions. Remembers about engorgement,. WH/LC brochure given w/resources, support groups and LC services.  Patient Name: Samantha [name] Today's Date: 04/01/2018 Reason for consult: Initial assessment   Maternal Data Has patient been taught Hand Expression?: Yes Does the patient have breastfeeding experience prior to this delivery?: Yes  Feeding Feeding Type: Bottle Fed - Formula Nipple Type: Slow - flow  LATCH Score Latch: (mother encouraged to call for latch)        Comfort (Breast/Nipple): Filling, red/small blisters or bruises, mild/mod discomfort        Interventions Interventions: Breast feeding basics reviewed  Lactation Tools Discussed/Used WIC Program: Yes   Consult Status Consult Status: Complete Date: 04/01/18    Samantha DancerCARVER, Samantha [name] 04/01/2018, 2:06 AM

## 2018-04-05 ENCOUNTER — Inpatient Hospital Stay (HOSPITAL_COMMUNITY): Admission: RE | Admit: 2018-04-05 | Payer: Medicaid Other | Source: Ambulatory Visit

## 2018-05-09 ENCOUNTER — Ambulatory Visit: Payer: Medicaid Other | Admitting: Student

## 2018-05-21 ENCOUNTER — Encounter: Payer: Self-pay | Admitting: *Deleted

## 2018-09-12 ENCOUNTER — Other Ambulatory Visit: Payer: Self-pay | Admitting: Advanced Practice Midwife

## 2018-09-12 DIAGNOSIS — Z3480 Encounter for supervision of other normal pregnancy, unspecified trimester: Secondary | ICD-10-CM

## 2018-09-19 NOTE — Telephone Encounter (Signed)
Message sent to provider 

## 2018-10-29 ENCOUNTER — Telehealth: Payer: Self-pay | Admitting: *Deleted

## 2018-10-29 NOTE — Telephone Encounter (Signed)
Due to current COVID 19 pandemic, our office is severely reducing in office visits until further notice, in order to minimize the risk to our patients and healthcare providers. Unable to get in contact with the patient to convert their office visit with Judson Roch 10/30/2018 into a doxy.me visit or mychart VV.   I left a voicemail asking the patient to return my call. Office number was provided.  This  VM left using [employer] [phone] [facility] [id number].     If patient calls back please convert their office visit into a doxy.me visit or my chart.

## 2018-10-30 ENCOUNTER — Ambulatory Visit: Payer: No Typology Code available for payment source | Admitting: Adult Health

## 2018-12-09 ENCOUNTER — Ambulatory Visit: Payer: Self-pay | Admitting: Family Medicine

## 2018-12-11 ENCOUNTER — Ambulatory Visit: Payer: Self-pay | Admitting: Family Medicine

## 2018-12-16 ENCOUNTER — Other Ambulatory Visit: Payer: Self-pay

## 2018-12-16 ENCOUNTER — Ambulatory Visit (INDEPENDENT_AMBULATORY_CARE_PROVIDER_SITE_OTHER): Payer: Self-pay | Admitting: Family Medicine

## 2018-12-16 ENCOUNTER — Encounter: Payer: Self-pay | Admitting: Family Medicine

## 2018-12-16 VITALS — BP 104/72 | HR 90 | Temp 98.0°F | Ht 63.0 in

## 2018-12-16 DIAGNOSIS — G43009 Migraine without aura, not intractable, without status migrainosus: Secondary | ICD-10-CM

## 2018-12-16 NOTE — Patient Instructions (Addendum)
Discussed: To prevent or relieve headaches, try the following:  Cool Compress. Lie down and place a cool compress on your head.   Avoid headache triggers. If certain foods or odors seem to have triggered your migraines in the past, avoid them. A headache diary might help you identify triggers.   Include physical activity in your daily routine. Try a daily walk or other moderate aerobic exercise.   Manage stress. Find healthy ways to cope with the stressors, such as delegating tasks on your to-do list.   Practice relaxation techniques. Try deep breathing, yoga, massage and visualization.   Eat regularly. Eating regularly scheduled meals and maintaining a healthy diet might help prevent headaches. Also, drink plenty of fluids.   Follow a regular sleep schedule. Sleep deprivation might contribute to headaches  Consider biofeedback. With this mind-body technique, you learn to control certain bodily functions - such as muscle tension, heart rate and blood pressure - to prevent headaches or reduce headache pain.    Try to avoid daily use of over the counter pain medications   Migraine Headache A migraine headache is an intense, throbbing pain on one side or both sides of the head. Migraine headaches may also cause other symptoms, such as nausea, vomiting, and sensitivity to light and noise. A migraine headache can last from 4 hours to 3 days. Talk with your doctor about what things may bring on (trigger) your migraine headaches. What are the causes? The exact cause of this condition is not known. However, a migraine may be caused when nerves in the brain become irritated and release chemicals that cause inflammation of blood vessels. This inflammation causes pain. This condition may be triggered or caused by:  Drinking alcohol.  Smoking.  Taking medicines, such as: ? Medicine used to treat chest pain (nitroglycerin). ? Birth control pills. ? Estrogen. ? Certain blood pressure  medicines.  Eating or drinking products that contain nitrates, glutamate, aspartame, or tyramine. Aged cheeses, chocolate, or caffeine may also be triggers.  Doing physical activity. Other things that may trigger a migraine headache include:  Menstruation.  Pregnancy.  Hunger.  Stress.  Lack of sleep or too much sleep.  Weather changes.  Fatigue. What increases the risk? The following factors may make you more likely to experience migraine headaches:  Being a certain age. This condition is more common in people who are 36-23 years old.  Being female.  Having a family history of migraine headaches.  Being Caucasian.  Having a mental health condition, such as depression or anxiety.  Being obese. What are the signs or symptoms? The main symptom of this condition is pulsating or throbbing pain. This pain may:  Happen in any area of the head, such as on one side or both sides.  Interfere with daily activities.  Get worse with physical activity.  Get worse with exposure to bright lights or loud noises. Other symptoms may include:  Nausea.  Vomiting.  Dizziness.  General sensitivity to bright lights, loud noises, or smells. Before you get a migraine headache, you may get warning signs (an aura). An aura may include:  Seeing flashing lights or having blind spots.  Seeing bright spots, halos, or zigzag lines.  Having tunnel vision or blurred vision.  Having numbness or a tingling feeling.  Having trouble talking.  Having muscle weakness. Some people have symptoms after a migraine headache (postdromal phase), such as:  Feeling tired.  Difficulty concentrating. How is this diagnosed? A migraine headache can be diagnosed based  on:  Your symptoms.  A physical exam.  Tests, such as: ? CT scan or an MRI of the head. These imaging tests can help rule out other causes of headaches. ? Taking fluid from the spine (lumbar puncture) and analyzing it  (cerebrospinal fluid analysis, or CSF analysis). How is this treated? This condition may be treated with medicines that:  Relieve pain.  Relieve nausea.  Prevent migraine headaches. Treatment for this condition may also include:  Acupuncture.  Lifestyle changes like avoiding foods that trigger migraine headaches.  Biofeedback.  Cognitive behavioral therapy. Follow these instructions at home: Medicines  Take over-the-counter and prescription medicines only as told by your health care provider.  Ask your health care provider if the medicine prescribed to you: ? Requires you to avoid driving or using heavy machinery. ? Can cause constipation. You may need to take these actions to prevent or treat constipation:  Drink enough fluid to keep your urine pale yellow.  Take over-the-counter or prescription medicines.  Eat foods that are high in fiber, such as beans, whole grains, and fresh fruits and vegetables.  Limit foods that are high in fat and processed sugars, such as fried or sweet foods. Lifestyle  Do not drink alcohol.  Do not use any products that contain nicotine or tobacco, such as cigarettes, e-cigarettes, and chewing tobacco. If you need help quitting, ask your health care provider.  Get at least 8 hours of sleep every night.  Find ways to manage stress, such as meditation, deep breathing, or yoga. General instructions      Keep a journal to find out what may trigger your migraine headaches. For example, write down: ? What you eat and drink. ? How much sleep you get. ? Any change to your diet or medicines.  If you have a migraine headache: ? Avoid things that make your symptoms worse, such as bright lights. ? It may help to lie down in a dark, quiet room. ? Do not drive or use heavy machinery. ? Ask your health care provider what activities are safe for you while you are experiencing symptoms.  Keep all follow-up visits as told by your health care  provider. This is important. Contact a health care provider if:  You develop symptoms that are different or more severe than your usual migraine headache symptoms.  You have more than 15 headache days in one month. Get help right away if:  Your migraine headache becomes severe.  Your migraine headache lasts longer than 72 hours.  You have a fever.  You have a stiff neck.  You have vision loss.  Your muscles feel weak or like you cannot control them.  You start to lose your balance often.  You have trouble walking.  You faint.  You have a seizure. Summary  A migraine headache is an intense, throbbing pain on one side or both sides of the head. Migraines may also cause other symptoms, such as nausea, vomiting, and sensitivity to light and noise.  This condition may be treated with medicines and lifestyle changes. You may also need to avoid certain things that trigger a migraine headache.  Keep a journal to find out what may trigger your migraine headaches.  Contact your health care provider if you have more than 15 headache days in a month or you develop symptoms that are different or more severe than your usual migraine headache symptoms. This information is not intended to replace advice given to you by your health care provider.  Make sure you discuss any questions you have with your health care provider. Document Released: 05/01/2005 Document Revised: 08/23/2018 Document Reviewed: 06/13/2018 Elsevier Patient Education  2020 [employer]  Propranolol tablets What is this medicine? PROPRANOLOL (proe PRAN oh lole) is a beta-blocker. Beta-blockers reduce the workload on the heart and help it to beat more regularly. This medicine is used to treat high blood pressure, to control irregular heart rhythms (arrhythmias) and to relieve chest pain caused by angina. It may also be helpful after a heart attack. This medicine is also used to prevent migraine headaches, relieve  uncontrollable shaking (tremors), and help certain problems related to the thyroid gland and adrenal gland. This medicine may be used for other purposes; ask your health care provider or pharmacist if you have questions. COMMON BRAND NAME(S): Inderal What should I tell my health care provider before I take this medicine? They need to know if you have any of these conditions:  circulation problems or blood vessel disease  diabetes  history of heart attack or heart disease, vasospastic angina  kidney disease  liver disease  lung or breathing disease, like asthma or emphysema  pheochromocytoma  slow heart rate  thyroid disease  an unusual or allergic reaction to propranolol, other beta-blockers, medicines, foods, dyes, or preservatives  pregnant or trying to get pregnant  breast-feeding How should I use this medicine? Take this medicine by mouth with a glass of water. Follow the directions on the prescription label. Take your doses at regular intervals. Do not take your medicine more often than directed. Do not stop taking except on your the advice of your doctor or health care professional. Talk to your pediatrician regarding the use of this medicine in children. Special care may be needed. Overdosage: If you think you have taken too much of this medicine contact a poison control center or emergency room at once. NOTE: This medicine is only for you. Do not share this medicine with others. What if I miss a dose? If you miss a dose, take it as soon as you can. If it is almost time for your next dose, take only that dose. Do not take double or extra doses. What may interact with this medicine? Do not take this medicine with any of the following medications:  feverfew  phenothiazines like chlorpromazine, mesoridazine, prochlorperazine, thioridazine This medicine may also interact with the following medications:  aluminum hydroxide gel  antipyrine  antiviral medicines for HIV  or AIDS  barbiturates like phenobarbital  certain medicines for blood pressure, heart disease, irregular heart beat  cimetidine  ciprofloxacin  diazepam  fluconazole  haloperidol  isoniazid  medicines for cholesterol like cholestyramine or colestipol  medicines for mental depression  medicines for migraine headache like almotriptan, eletriptan, frovatriptan, naratriptan, rizatriptan, sumatriptan, zolmitriptan  NSAIDs, medicines for pain and inflammation, like ibuprofen or naproxen  phenytoin  rifampin  teniposide  theophylline  thyroid medicines  tolbutamide  warfarin  zileuton This list may not describe all possible interactions. Give your health care provider a list of all the medicines, herbs, non-prescription drugs, or dietary supplements you use. Also tell them if you smoke, drink alcohol, or use illegal drugs. Some items may interact with your medicine. What should I watch for while using this medicine? Visit your doctor or health care professional for regular check ups. Check your blood pressure and pulse rate regularly. Ask your health care professional what your blood pressure and pulse rate should be, and when you should contact them. You  may get drowsy or dizzy. Do not drive, use machinery, or do anything that needs mental alertness until you know how this drug affects you. Do not stand or sit up quickly, especially if you are an older patient. This reduces the risk of dizzy or fainting spells. Alcohol can make you more drowsy and dizzy. Avoid alcoholic drinks. This medicine may increase blood sugar. Ask your healthcare provider if changes in diet or medicines are needed if you have diabetes. Do not treat yourself for coughs, colds, or pain while you are taking this medicine without asking your doctor or health care professional for advice. Some ingredients may increase your blood pressure. What side effects may I notice from receiving this medicine? Side  effects that you should report to your doctor or health care professional as soon as possible:  allergic reactions like skin rash, itching or hives, swelling of the face, lips, or tongue  breathing problems  cold hands or feet  difficulty sleeping, nightmares  dry peeling skin  hallucinations  muscle cramps or weakness   signs and symptoms of high blood sugar such as being more thirsty or hungry or having to urinate more than normal. You may also feel very tired or have blurry vision.  slow heart rate  swelling of the legs and ankles  vomiting Side effects that usually do not require medical attention (report to your doctor or health care professional if they continue or are bothersome):  change in sex drive or performance  diarrhea  dry sore eyes  hair loss  nausea  weak or tired This list may not describe all possible side effects. Call your doctor for medical advice about side effects. You may report side effects to FDA at 1-800-FDA-1088. Where should I keep my medicine? Keep out of the reach of children. Store at room temperature between 15 and 30 degrees C (59 and 86 degrees F). Protect from light. Throw away any unused medicine after the expiration date. NOTE: This sheet is a summary. It may not cover all possible information. If you have questions about this medicine, talk to your doctor, pharmacist, or health care provider.  2020 Elsevier/Gold Standard (2018-02-20 08:41:59)  Amitriptyline tablets What is this medicine? AMITRIPTYLINE (a mee TRIP ti leen) is used to treat depression. This medicine may be used for other purposes; ask your health care provider or pharmacist if you have questions. COMMON BRAND NAME(S): Elavil, Vanatrip What should I tell my health care provider before I take this medicine? They need to know if you have any of these conditions:  an alcohol problem  asthma, difficulty breathing  bipolar disorder or schizophrenia  difficulty  passing urine, prostate trouble  glaucoma  heart disease or previous heart attack  liver disease  over active thyroid  seizures  thoughts or plans of suicide, a previous suicide attempt, or family history of suicide attempt  an unusual or allergic reaction to amitriptyline, other medicines, foods, dyes, or preservatives  pregnant or trying to get pregnant  breast-feeding How should I use this medicine? Take this medicine by mouth with a drink of water. Follow the directions on the prescription label. You can take the tablets with or without food. Take your medicine at regular intervals. Do not take it more often than directed. Do not stop taking this medicine suddenly except upon the advice of your doctor. Stopping this medicine too quickly may cause serious side effects or your condition may worsen. A special MedGuide will be given to you by  the pharmacist with each prescription and refill. Be sure to read this information carefully each time. Talk to your pediatrician regarding the use of this medicine in children. Special care may be needed. Overdosage: If you think you have taken too much of this medicine contact a poison control center or emergency room at once. NOTE: This medicine is only for you. Do not share this medicine with others. What if I miss a dose? If you miss a dose, take it as soon as you can. If it is almost time for your next dose, take only that dose. Do not take double or extra doses. What may interact with this medicine? Do not take this medicine with any of the following medications:  arsenic trioxide  certain medicines used to regulate abnormal heartbeat or to treat other heart conditions  cisapride  droperidol  halofantrine  linezolid  MAOIs like Carbex, Eldepryl, Marplan, Nardil, and Parnate  methylene blue  other medicines for mental depression  phenothiazines like perphenazine, thioridazine and chlorpromazine  pimozide  probucol   procarbazine  sparfloxacin  St. John's Wort This medicine may also interact with the following medications:  atropine and related drugs like hyoscyamine, scopolamine, tolterodine and others  barbiturate medicines for inducing sleep or treating seizures, like phenobarbital  cimetidine  disulfiram  ethchlorvynol  thyroid hormones such as levothyroxine  ziprasidone This list may not describe all possible interactions. Give your health care provider a list of all the medicines, herbs, non-prescription drugs, or dietary supplements you use. Also tell them if you smoke, drink alcohol, or use illegal drugs. Some items may interact with your medicine. What should I watch for while using this medicine? Tell your doctor if your symptoms do not get better or if they get worse. Visit your doctor or health care professional for regular checks on your progress. Because it may take several weeks to see the full effects of this medicine, it is important to continue your treatment as prescribed by your doctor. Patients and their families should watch out for new or worsening thoughts of suicide or depression. Also watch out for sudden changes in feelings such as feeling anxious, agitated, panicky, irritable, hostile, aggressive, impulsive, severely restless, overly excited and hyperactive, or not being able to sleep. If this happens, especially at the beginning of treatment or after a change in dose, call your health care professional. Bonita Quin may get drowsy or dizzy. Do not drive, use machinery, or do anything that needs mental alertness until you know how this medicine affects you. Do not stand or sit up quickly, especially if you are an older patient. This reduces the risk of dizzy or fainting spells. Alcohol may interfere with the effect of this medicine. Avoid alcoholic drinks. Do not treat yourself for coughs, colds, or allergies without asking your doctor or health care professional for advice. Some  ingredients can increase possible side effects. Your mouth may get dry. Chewing sugarless gum or sucking hard candy, and drinking plenty of water will help. Contact your doctor if the problem does not go away or is severe. This medicine may cause dry eyes and blurred vision. If you wear contact lenses you may feel some discomfort. Lubricating drops may help. See your eye doctor if the problem does not go away or is severe. This medicine can cause constipation. Try to have a bowel movement at least every 2 to 3 days. If you do not have a bowel movement for 3 days, call your doctor or health care professional.  This medicine can make you more sensitive to the sun. Keep out of the sun. If you cannot avoid being in the sun, wear protective clothing and use sunscreen. Do not use sun lamps or tanning beds/booths. What side effects may I notice from receiving this medicine? Side effects that you should report to your doctor or health care professional as soon as possible:  allergic reactions like skin rash, itching or hives, swelling of the face, lips, or tongue  anxious  breathing problems  changes in vision  confusion  elevated mood, decreased need for sleep, racing thoughts, impulsive behavior  eye pain  fast, irregular heartbeat  feeling faint or lightheaded, falls  feeling agitated, angry, or irritable  fever with increased sweating  hallucination, loss of contact with reality  seizures  stiff muscles  suicidal thoughts or other mood changes  tingling, pain, or numbness in the feet or hands  trouble passing urine or change in the amount of urine  trouble sleeping  unusually weak or tired  vomiting  yellowing of the eyes or skin Side effects that usually do not require medical attention (report to your doctor or health care professional if they continue or are bothersome):  change in sex drive or performance  change in appetite or weight  constipation  dizziness   dry mouth  nausea  tired  tremors  upset stomach This list may not describe all possible side effects. Call your doctor for medical advice about side effects. You may report side effects to FDA at 1-800-FDA-1088. Where should I keep my medicine? Keep out of the reach of children. Store at room temperature between 20 and 25 degrees C (68 and 77 degrees F). Throw away any unused medicine after the expiration date. NOTE: This sheet is a summary. It may not cover all possible information. If you have questions about this medicine, talk to your doctor, pharmacist, or health care provider.  2020 Elsevier/Gold Standard (2018-04-23 13:04:32)   Rizatriptan tablets What is this medicine? RIZATRIPTAN (rye za TRIP tan) is used to treat migraines with or without aura. An aura is a strange feeling or visual disturbance that warns you of an attack. It is not used to prevent migraines. This medicine may be used for other purposes; ask your health care provider or pharmacist if you have questions. COMMON BRAND NAME(S): Maxalt What should I tell my health care provider before I take this medicine? They need to know if you have any of these conditions:  cigarette smoker  circulation problems in fingers and toes  diabetes  heart disease  high blood pressure  high cholesterol  history of irregular heartbeat  history of stroke  kidney disease  liver disease  stomach or intestine problems  an unusual or allergic reaction to rizatriptan, other medicines, foods, dyes, or preservatives  pregnant or trying to get pregnant  breast-feeding How should I use this medicine? Take this medicine by mouth with a glass of water. Follow the directions on the prescription label. Do not take it more often than directed. Talk to your pediatrician regarding the use of this medicine in children. While this drug may be prescribed for children as young as 6 years for selected conditions, precautions do  apply. Overdosage: If you think you have taken too much of this medicine contact a poison control center or emergency room at once. NOTE: This medicine is only for you. Do not share this medicine with others. What if I miss a dose? This does not apply.  This medicine is not for regular use. What may interact with this medicine? Do not take this medicine with any of the following medicines:  certain medicines for migraine headache like almotriptan, eletriptan, frovatriptan, naratriptan, rizatriptan, sumatriptan, zolmitriptan  ergot alkaloids like dihydroergotamine, ergonovine, ergotamine, methylergonovine  MAOIs like Carbex, Eldepryl, Marplan, Nardil, and Parnate This medicine may also interact with the following medications:  certain medicines for depression, anxiety, or psychotic disorders  propranolol This list may not describe all possible interactions. Give your health care provider a list of all the medicines, herbs, non-prescription drugs, or dietary supplements you use. Also tell them if you smoke, drink alcohol, or use illegal drugs. Some items may interact with your medicine. What should I watch for while using this medicine? Visit your healthcare professional for regular checks on your progress. Tell your healthcare professional if your symptoms do not start to get better or if they get worse. You may get drowsy or dizzy. Do not drive, use machinery, or do anything that needs mental alertness until you know how this medicine affects you. Do not stand up or sit up quickly, especially if you are an older patient. This reduces the risk of dizzy or fainting spells. Alcohol may interfere with the effect of this medicine. Your mouth may get dry. Chewing sugarless gum or sucking hard candy and drinking plenty of water may help. Contact your healthcare professional if the problem does not go away or is severe. If you take migraine medicines for 10 or more days a month, your migraines may get  worse. Keep a diary of headache days and medicine use. Contact your healthcare professional if your migraine attacks occur more frequently. What side effects may I notice from receiving this medicine? Side effects that you should report to your doctor or health care professional as soon as possible:  allergic reactions like skin rash, itching or hives, swelling of the face, lips, or tongue  chest pain or chest tightness  signs and symptoms of a dangerous change in heartbeat or heart rhythm like chest pain; dizziness; fast, irregular heartbeat; palpitations; feeling faint or lightheaded; falls; breathing problems  signs and symptoms of a stroke like changes in vision; confusion; trouble speaking or understanding; severe headaches; sudden numbness or weakness of the face, arm or leg; trouble walking; dizziness; loss of balance or coordination  signs and symptoms of serotonin syndrome like irritable; confusion; diarrhea; fast or irregular heartbeat; muscle twitching; stiff muscles; trouble walking; sweating; high fever; seizures; chills; vomiting Side effects that usually do not require medical attention (report to your doctor or health care professional if they continue or are bothersome):  diarrhea  dizziness  drowsiness  dry mouth  headache  nausea, vomiting  pain, tingling, numbness in the hands or feet  stomach pain This list may not describe all possible side effects. Call your doctor for medical advice about side effects. You may report side effects to FDA at 1-800-FDA-1088. Where should I keep my medicine? Keep out of the reach of children. Store at room temperature between 15 and 30 degrees C (59 and 86 degrees F). Keep container tightly closed. Throw away any unused medicine after the expiration date. NOTE: This sheet is a summary. It may not cover all possible information. If you have questions about this medicine, talk to your doctor, pharmacist, or health care provider.   2020 Elsevier/Gold Standard (2017-11-13 14:59:59)

## 2018-12-16 NOTE — Progress Notes (Signed)
PATIENT: Samantha [name] DOB: 12-27-1985  REASON FOR VISIT: follow up HISTORY FROM: patient  Chief Complaint  Patient presents with   Follow-up    Migraine follow up. Interpreter present. Rm 1. Been having migraines everyday for the past year.     HISTORY OF PRESENT ILLNESS: Today 12/16/18 Samantha [name] is a 33 y.o. female here today for follow up for migraines. She was last seen in 2018 when Dr Lucia GaskinsAhern started her on Effexor for prevention. She states that she took one tablet and did not like the way it made her feel. A few months later she became pregnant and was told by her OB/GYN not to start medication. She is 8 months post partum and headaches have returned. She reports nearly daily headaches with 2-3 of those being migrainous each week. She does have throbbing pain with light/sound sensitivity and nausea. She is taking ibuprofen OTC on a daily basis. She has 4 children. She is breastfeeding. She is hesitant to take any medications that could effect her baby.   HISTORY: (copied from Dr Trevor MaceAhern's note on 04/27/2017)  HPI:  Samantha [name] is a 33 y.o. female here as a referral from Dr. Tiburcio PeaHarris for migrianes. She is here with an interpreter. PMHx depression, migraine. She has been non cpm[liant with medication, she did not take her Topamax every day and stopped it on her own. She only took a few and it didn;t help so she stopped. Explained that medication takes 8-12 weeks, she can;t stop it. She takes Excedrin which helps her headaches. Her  Headaches are throbbing around the eyes, with light and sound sensitivity. She has had headaches for years. They have no changed in quality. She has migraines 4-5 days a month. She says she is headache free 25 days a month. She can take Excedrin as long as she takes it only 4-5 times a month. Discussed medication rebound, as long as she doesn't take it more than 10 days in a month. She has kids at home. She endorses depression, sadness, she is sad some most days  and she does cry. No vision changes, no paresthesias or weakness, no changes in quality, severity has improved from July, no red flags. Headaches started as child.   Reviewed notes, labs and imaging from outside physicians, which showed:  CMP unremarkable, tsh nml  Reviewed referring physician notes.  Patient was seen multiple times in July September and October for headaches.  Patient describing unilateral headaches almost daily, in the setting of stress or sadness, been taking Motrin or Aleve, which has caused GI upset and she is been to the emergency room multiple times in the last year or 2 for abdominal pain.  She also describes light sensitivity, sound sensitivity, nausea.  She was given Fioricet which did not improve symptoms.  Headaches are throbbing and painful.  At initial appointment the headaches were daily, in September she described 9-10 times per month.  Taking Excedrin.  In October she described them as 4 times per month.  She was prescribed Topamax and Imitrex.  Lying down in a dark room helps.  Headaches are frontal.  No associated visual disturbances, vomiting.   REVIEW OF SYSTEMS: Out of a complete 14 system review of symptoms, the patient complains only of the following symptoms, headaches, speech difficulty and weakness and all other reviewed systems are negative.   ALLERGIES: No Known Allergies  HOME MEDICATIONS: Outpatient Medications Prior to Visit  Medication Sig Dispense Refill   cyclobenzaprine (FLEXERIL) 10 MG  tablet Take 1 tablet (10 mg total) by mouth every 8 (eight) hours as needed for muscle spasms. 30 tablet 0   ibuprofen (ADVIL,MOTRIN) 600 MG tablet Take 1 tablet (600 mg total) by mouth every 6 (six) hours. 30 tablet 0   omeprazole (PRILOSEC) 20 MG capsule Take 20 mg by mouth daily.     Prenatal Vit-Fe Fumarate-FA (PREPLUS) 27-1 MG TABS TAKE 1 TABLET BY MOUTH DAILY 30 tablet 11   Elastic Bandages & Supports (COMFORT FIT MATERNITY SUPP LG) MISC 1 each  by Does not apply route daily as needed. (Patient not taking: Reported on 12/16/2018) 1 each 0   No facility-administered medications prior to visit.     PAST MEDICAL HISTORY: Past Medical History:  Diagnosis Date   Migraine     PAST SURGICAL HISTORY: Past Surgical History:  Procedure Laterality Date   CESAREAN SECTION      FAMILY HISTORY: Family History  Problem Relation Age of Onset   Asthma Father    Migraines Mother     SOCIAL HISTORY: Social History   Socioeconomic History   Marital status: Married    Spouse name: Not on file   Number of children: Not on file   Years of education: Not on file   Highest education level: Not on file  Occupational History   Not on file  Social Needs   Financial resource strain: Not on file   Food insecurity    Worry: Not on file    Inability: Not on file   Transportation needs    Medical: Not on file    Non-medical: Not on file  Tobacco Use   Smoking status: Never Smoker   Smokeless tobacco: Never Used  Substance and Sexual Activity   Alcohol use: No   Drug use: No   Sexual activity: Yes    Birth control/protection: None  Lifestyle   Physical activity    Days per week: Not on file    Minutes per session: Not on file   Stress: Not on file  Relationships   Social connections    Talks on phone: Not on file    Gets together: Not on file    Attends religious service: Not on file    Active member of club or organization: Not on file    Attends meetings of clubs or organizations: Not on file    Relationship status: Not on file   Intimate partner violence    Fear of current or ex partner: Not on file    Emotionally abused: Not on file    Physically abused: Not on file    Forced sexual activity: Not on file  Other Topics Concern   Not on file  Social History Narrative   Not on file      PHYSICAL EXAM  Vitals:   12/16/18 0754  BP: 104/72  Pulse: 90  Temp: 98 F (36.7 C)  TempSrc: Oral    Height: 5\' 3"  (1.6 m)   Body mass index is 27.88 kg/m.  Generalized: Well developed, in no acute distress  Cardiology: normal rate and rhythm, no murmur noted Neurological examination  Mentation: Alert oriented to time, place, history taking. Follows all commands speech and language fluent Cranial nerve II-XII: Pupils were equal round reactive to light. Extraocular movements were full (patient not able to correctly state numbers with peripheral vision testing) , visual field were full on confrontational test. Facial sensation and strength were normal. Uvula tongue midline. Head turning and shoulder shrug  were normal and symmetric. Motor: The motor testing reveals 5 over 5 strength of all 4 extremities. Good symmetric motor tone is noted throughout.  Gait and station: Gait is normal.   DIAGNOSTIC DATA (LABS, IMAGING, TESTING) - I reviewed patient records, labs, notes, testing and imaging myself where available.  No flowsheet data found.   Lab Results  Component Value Date   WBC 8.3 03/29/2018   HGB 12.4 03/29/2018   HCT 37.4 03/29/2018   MCV 94.7 03/29/2018   PLT 169 03/29/2018      Component Value Date/Time   NA 135 08/21/2017 2007   K 3.7 08/21/2017 2007   CL 107 08/21/2017 2007   CO2 20 (L) 08/21/2017 2007   GLUCOSE 127 (H) 08/21/2017 2007   GLUCOSE 78 09/03/2014 1434   BUN 13 08/21/2017 2007   CREATININE 0.47 08/21/2017 2007   CREATININE 0.70 11/30/2016 1555   CALCIUM 8.8 (L) 08/21/2017 2007   PROT 7.3 08/21/2017 2007   ALBUMIN 3.9 08/21/2017 2007   AST 17 08/21/2017 2007   ALT 16 08/21/2017 2007   ALKPHOS 35 (L) 08/21/2017 2007   BILITOT 0.4 08/21/2017 2007   GFRNONAA >60 08/21/2017 2007   GFRNONAA >89 11/30/2016 1555   GFRAA >60 08/21/2017 2007   GFRAA >89 11/30/2016 1555   No results found for: CHOL, HDL, LDLCALC, LDLDIRECT, TRIG, CHOLHDL Lab Results  Component Value Date   HGBA1C 5.1 01/03/2016   No results found for: [id number] Lab Results  Component  Value Date   TSH 1.12 11/30/2016     ASSESSMENT AND PLAN 33 y.o. year old female  has a past medical history of Migraine. here with     ICD-10-CM   1. Migraine without aura and without status migrainosus, not intractable  G43.009     Mrs Mullinax has suffered worsening of headaches with frequent migraines since the birth of her child 8 months ago.  She does continue to breast-feed and plans continuation for the next year.  She is very hesitant about adding any medications that can potentially be found in breastmilk.  She wishes to treat with nonpharmacological options for now.  We have discussed complementary therapies as the safest option for migraine management.  She was encouraged to stay well-hydrated, exercise regularly, eat a well-balanced diet, use heat/ice for compression, and rest with migraine.  We have discussed the importance of avoiding use of over-the-counter analgesics on a regular basis.  We have discussed adding propranolol or amitriptyline for a daily preventative therapy.  I have given her information to review with her husband who speaks [place].  We have also discussed using rizatriptan for abortive therapy.  She will read about these medications and call should she change her mind.  She was encouraged to follow-up in 6 months, sooner if needed.  She verbalizes understanding and agreement with this plan.    No orders of the defined types were placed in this encounter.    No orders of the defined types were placed in this encounter.    I spent 45 minutes with the patient. 50% of this time was spent counseling and educating patient on plan of care and medications.     Debbora Presto, FNP-C 12/16/2018, 10:11 AM [facility] [address] [place],  [address] [phone]

## 2018-12-18 NOTE — Progress Notes (Signed)
Made any corrections needed, and agree with history, physical, neuro exam,assessment and plan as stated.     Antonia Ahern, MD [facility]  

## 2018-12-23 ENCOUNTER — Telehealth: Payer: Self-pay | Admitting: Family Medicine

## 2018-12-23 ENCOUNTER — Other Ambulatory Visit: Payer: Self-pay | Admitting: Family Medicine

## 2018-12-23 MED ORDER — PROPRANOLOL HCL 20 MG PO TABS: 20.0000 mg | ORAL_TABLET | Freq: Two times a day (BID) | ORAL | 5 refills | Status: AC

## 2018-12-23 NOTE — Telephone Encounter (Signed)
Pt's husband called and stated that the pt would like to get the prescription for the Propanolol. Please advise.

## 2018-12-23 NOTE — Telephone Encounter (Signed)
Called to inform the pt's husband that we have sent a script for the propranolol for the patient. Advised importance of making sure to monitor heartrate and BP. Advised to call back with any questions.

## 2018-12-23 NOTE — Telephone Encounter (Signed)
Please let him know that I have called in propranolol 20mg  twice daily. She will need to monitor heart rate and blood pressure. She should stop medication for any unwanted side effects as discussed in the office and provided in AVS. We should see her back in 3 months for follow up.

## 2018-12-23 NOTE — Telephone Encounter (Signed)
Called the husband back and advised the medication and the potential side effects and have advised the patients husband that the script was sent to the pharmacy on file.

## 2018-12-23 NOTE — Telephone Encounter (Signed)
Pt husband has called back, message from [place] was relayed. He is asking that RN Myriam Jacobson calls him back,

## 2019-05-28 ENCOUNTER — Other Ambulatory Visit: Payer: Self-pay

## 2019-05-28 ENCOUNTER — Emergency Department (HOSPITAL_BASED_OUTPATIENT_CLINIC_OR_DEPARTMENT_OTHER): Payer: Self-pay

## 2019-05-28 ENCOUNTER — Emergency Department (HOSPITAL_BASED_OUTPATIENT_CLINIC_OR_DEPARTMENT_OTHER)
Admission: EM | Admit: 2019-05-28 | Discharge: 2019-05-28 | Disposition: A | Discharge status: Home / Self Care | Payer: Self-pay | Attending: Emergency Medicine | Admitting: Emergency Medicine

## 2019-05-28 ENCOUNTER — Encounter (HOSPITAL_BASED_OUTPATIENT_CLINIC_OR_DEPARTMENT_OTHER): Payer: Self-pay | Admitting: *Deleted

## 2019-05-28 DIAGNOSIS — M7918 Myalgia, other site: Secondary | ICD-10-CM | POA: Insufficient documentation

## 2019-05-28 DIAGNOSIS — Z87442 Personal history of urinary calculi: Secondary | ICD-10-CM | POA: Insufficient documentation

## 2019-05-28 DIAGNOSIS — R1013 Epigastric pain: Secondary | ICD-10-CM | POA: Insufficient documentation

## 2019-05-28 DIAGNOSIS — M79604 Pain in right leg: Secondary | ICD-10-CM | POA: Insufficient documentation

## 2019-05-28 DIAGNOSIS — M79605 Pain in left leg: Secondary | ICD-10-CM | POA: Insufficient documentation

## 2019-05-28 LAB — URINALYSIS, ROUTINE W REFLEX MICROSCOPIC
Bilirubin Urine: NEGATIVE
Glucose, UA: NEGATIVE mg/dL
Ketones, ur: NEGATIVE mg/dL
Leukocytes,Ua: NEGATIVE
Nitrite: NEGATIVE
Protein, ur: NEGATIVE mg/dL
Specific Gravity, Urine: 1.02 (ref 1.005–1.030)
pH: 7 (ref 5.0–8.0)

## 2019-05-28 LAB — COMPREHENSIVE METABOLIC PANEL
ALT: 30 U/L (ref 0–44)
AST: 26 U/L (ref 15–41)
Albumin: 4.2 g/dL (ref 3.5–5.0)
Alkaline Phosphatase: 64 U/L (ref 38–126)
Anion gap: 5 (ref 5–15)
BUN: 20 mg/dL (ref 6–20)
CO2: 24 mmol/L (ref 22–32)
Calcium: 8.8 mg/dL — ABNORMAL LOW (ref 8.9–10.3)
Chloride: 105 mmol/L (ref 98–111)
Creatinine, Ser: 0.7 mg/dL (ref 0.44–1.00)
GFR calc Af Amer: >60 mL/min (ref >60)
GFR calc non Af Amer: >60 mL/min (ref >60)
Glucose, Bld: 136 mg/dL — ABNORMAL HIGH (ref 70–99)
Potassium: 3.8 mmol/L (ref 3.5–5.1)
Sodium: 134 mmol/L — ABNORMAL LOW (ref 135–145)
Total Bilirubin: 0.5 mg/dL (ref 0.3–1.2)
Total Protein: 8 g/dL (ref 6.5–8.1)

## 2019-05-28 LAB — URINALYSIS, MICROSCOPIC (REFLEX)

## 2019-05-28 LAB — CBC WITH DIFFERENTIAL/PLATELET
Abs Immature Granulocytes: 0.02 10*3/uL (ref 0.00–0.07)
Basophils Absolute: 0 10*3/uL (ref 0.0–0.1)
Basophils Relative: 0 %
Eosinophils Absolute: 0.1 10*3/uL (ref 0.0–0.5)
Eosinophils Relative: 1 %
HCT: 41.6 % (ref 36.0–46.0)
Hemoglobin: 13.5 g/dL (ref 12.0–15.0)
Immature Granulocytes: 0 %
Lymphocytes Relative: 29 %
Lymphs Abs: 2.4 10*3/uL (ref 0.7–4.0)
MCH: 29.2 pg (ref 26.0–34.0)
MCHC: 32.5 g/dL (ref 30.0–36.0)
MCV: 90 fL (ref 80.0–100.0)
Monocytes Absolute: 0.6 10*3/uL (ref 0.1–1.0)
Monocytes Relative: 7 %
Neutro Abs: 5.2 10*3/uL (ref 1.7–7.7)
Neutrophils Relative %: 63 %
Platelets: 258 10*3/uL (ref 150–400)
RBC: 4.62 MIL/uL (ref 3.87–5.11)
RDW: 12.3 % (ref 11.5–15.5)
WBC: 8.3 10*3/uL (ref 4.0–10.5)
nRBC: 0 % (ref 0.0–0.2)

## 2019-05-28 LAB — PREGNANCY, URINE: Preg Test, Ur: NEGATIVE

## 2019-05-28 LAB — LIPASE, BLOOD: Lipase: 25 U/L (ref 11–51)

## 2019-05-28 MED ORDER — SUCRALFATE 1 G PO TABS: 1.0000 g | ORAL_TABLET | Freq: Three times a day (TID) | ORAL | 0 refills | Status: AC

## 2019-05-28 MED ORDER — METHOCARBAMOL 500 MG PO TABS: 500.0000 mg | ORAL_TABLET | Freq: Two times a day (BID) | ORAL | 0 refills | Status: AC

## 2019-05-28 MED ORDER — SUCRALFATE 1 GM/10ML PO SUSP: 1.0000 g | Freq: Three times a day (TID) | ORAL | 0 refills | Status: AC

## 2019-05-28 MED ORDER — ALUM & MAG HYDROXIDE-SIMETH 200-200-20 MG/5ML PO SUSP
30.0000 mL | Freq: Once | ORAL | Status: AC
  Administered 2019-05-28: 30 mL via ORAL
  Filled 2019-05-28: qty 30

## 2019-05-28 MED ORDER — LIDOCAINE VISCOUS HCL 2 % MT SOLN
15.0000 mL | Freq: Once | OROMUCOSAL | Status: AC
  Administered 2019-05-28: 15 mL via ORAL
  Filled 2019-05-28: qty 15

## 2019-05-28 NOTE — ED Notes (Signed)
ED Provider at bedside. 

## 2019-05-28 NOTE — Discharge Instructions (Signed)
As we discussed, your work-up today was reassuring.  Take Robaxin as prescribed for muscle aches. This medication will make you drowsy so do not drive or drink alcohol when taking it.  You should not breast-feed while taking this medication.  Usually, I prescribed you Carafate to help with your stomach pain.  Return emergency department for any worsening abdominal pain, chest pain, difficulty breathing, vomiting or any other worsening or concerning symptoms.

## 2019-05-28 NOTE — ED Provider Notes (Signed)
Care assumed from [facility], PA-C at shift change with labs and RUQ U/S pending.  In brief, this patient is a 34 y.o. F past history of migraines who presents emergency department for 10 days of abdominal pain.  She reports that is mostly in the epigastric region.  She states it is worse after she eats.  She has not had any nausea/vomiting.  Also reports bilateral leg pain that has been ongoing for about a year since the birth of her daughter.  Please see note from previous provider for full history/physical exam.  Physical Exam  BP 124/77   Pulse 74   Temp 97.8 F (36.6 C) (Oral)   Resp 16   Ht 5\' 4"  (1.626 m)   Wt 81 kg   LMP 05/07/2019   SpO2 100%   BMI 30.65 kg/m   Physical Exam  Abdomen soft, nondistended.  No tenderness noted to palpation. 5/5 strength of BUE and BLE.   ED Course/Procedures   Clinical Course as of May 28 2135  Wed May 28, 2019  1735 Patient presents emergency department for epigastric pain worse with eating for the past 10 days.  Afebrile and no associated symptoms.  She also would like relief of the leg pain that she has been having for the past 1 year.  It is bilateral and got worse after she had a baby.  Patient is not having any saddle anesthesia, loss of control of bowel or bladder, fever.  I think your leg pain is probably musculoskeletal.  I am more concerned with her abdominal pain which may be related to gallbladder disease versus gastritis.  Will obtain labs, urinalysis, right upper quadrant ultrasound and start with a GI cocktail.   [KM]  1914 Patient care signed out to [facility] PA-C due to change of shift.   [KM]    Clinical Course User Index [KM] Alveria Apley, PA-C    Procedures  Results for orders placed or performed during the hospital encounter of 05/28/19 (from the past 24 hour(s))  Urinalysis, Routine w reflex microscopic     Status: Abnormal   Collection Time: 05/28/19  5:33 PM  Result Value Ref Range   Color, Urine YELLOW  YELLOW   APPearance CLEAR CLEAR   Specific Gravity, Urine 1.020 1.005 - 1.030   pH 7.0 5.0 - 8.0   Glucose, UA NEGATIVE NEGATIVE mg/dL   Hgb urine dipstick TRACE (A) NEGATIVE   Bilirubin Urine NEGATIVE NEGATIVE   Ketones, ur NEGATIVE NEGATIVE mg/dL   Protein, ur NEGATIVE NEGATIVE mg/dL   Nitrite NEGATIVE NEGATIVE   Leukocytes,Ua NEGATIVE NEGATIVE  Pregnancy, urine     Status: None   Collection Time: 05/28/19  5:33 PM  Result Value Ref Range   Preg Test, Ur NEGATIVE NEGATIVE  Urinalysis, Microscopic (reflex)     Status: Abnormal   Collection Time: 05/28/19  5:33 PM  Result Value Ref Range   RBC / HPF 0-5 0 - 5 RBC/hpf   WBC, UA 0-5 0 - 5 WBC/hpf   Bacteria, UA FEW (A) NONE SEEN   Squamous Epithelial / LPF 0-5 0 - 5   Mucus PRESENT    Amorphous Crystal PRESENT   CBC with Differential     Status: None   Collection Time: 05/28/19  5:54 PM  Result Value Ref Range   WBC 8.3 4.0 - 10.5 K/uL   RBC 4.62 3.87 - 5.11 MIL/uL   Hemoglobin 13.5 12.0 - 15.0 g/dL   HCT 41.6 36.0 -  46.0 %   MCV 90.0 80.0 - 100.0 fL   MCH 29.2 26.0 - 34.0 pg   MCHC 32.5 30.0 - 36.0 g/dL   RDW 56.8 12.7 - 51.7 %   Platelets 258 150 - 400 K/uL   nRBC 0.0 0.0 - 0.2 %   Neutrophils Relative % 63 %   Neutro Abs 5.2 1.7 - 7.7 K/uL   Lymphocytes Relative 29 %   Lymphs Abs 2.4 0.7 - 4.0 K/uL   Monocytes Relative 7 %   Monocytes Absolute 0.6 0.1 - 1.0 K/uL   Eosinophils Relative 1 %   Eosinophils Absolute 0.1 0.0 - 0.5 K/uL   Basophils Relative 0 %   Basophils Absolute 0.0 0.0 - 0.1 K/uL   Immature Granulocytes 0 %   Abs Immature Granulocytes 0.02 0.00 - 0.07 K/uL  Comprehensive metabolic panel     Status: Abnormal   Collection Time: 05/28/19  5:54 PM  Result Value Ref Range   Sodium 134 (L) 135 - 145 mmol/L   Potassium 3.8 3.5 - 5.1 mmol/L   Chloride 105 98 - 111 mmol/L   CO2 24 22 - 32 mmol/L   Glucose, Bld 136 (H) 70 - 99 mg/dL   BUN 20 6 - 20 mg/dL   Creatinine, Ser 0.01 0.44 - 1.00 mg/dL    Calcium 8.8 (L) 8.9 - 10.3 mg/dL   Total Protein 8.0 6.5 - 8.1 g/dL   Albumin 4.2 3.5 - 5.0 g/dL   AST 26 15 - 41 U/L   ALT 30 0 - 44 U/L   Alkaline Phosphatase 64 38 - 126 U/L   Total Bilirubin 0.5 0.3 - 1.2 mg/dL   GFR calc non Af Amer >60 >60 mL/min   GFR calc Af Amer >60 >60 mL/min   Anion gap 5 5 - 15  Lipase, blood     Status: None   Collection Time: 05/28/19  5:54 PM  Result Value Ref Range   Lipase 25 11 - 51 U/L    MDM   PLAN: Patient pending labs and ultrasound.  MDM:  Lipase is unremarkable.  CBC shows no leukocytosis or anemia.  CMP shows sodium 134, BUN of 20, creatinine of 0.70.  Urine pregnancy is negative.  UA negative for any infectious etiology.  Ultrasound shows contracted gallbladder.  No shadowing stones.  I discussed with patient using the language interpreter.  She reports improvement in pain after GI cocktail here in the emergency department.  On her abdominal exam, she has no tenderness palpation.  I discussed with her regarding her work-up..  Patient reports that she has been having some body aches for the last 2 weeks and she has been taking ibuprofen daily.  I discussed with her that as this could irritate her stomach, make her more susceptible to ulcer disease.  Per previous provider, no saddle anesthesia, urinary or bowel incontinence.  She has no numbness/weakness for me and has been ambulatory in the emergency department.  I am unsure the etiology of her leg pain but I discussed with her establishing primary care for further evaluation.  I did discuss with her that could be musculoskeletal nature and will plan to send her home with Robaxin.  Patient with no known drug allergies. With patient's permission, I discussed with patient's husband. He is agreeable to plan. At this time, patient exhibits no emergent life-threatening condition that require further evaluation in ED. Patient had ample opportunity for questions and discussion. All patient's questions  were answered with  full understanding. Strict return precautions discussed. Patient expresses understanding and agreement to plan.    1. Musculoskeletal pain   2. Epigastric pain    Portions of this note were generated with Dragon dictation software. Dictation errors may occur despite best attempts at proofreading.    Maxwell Caul, PA-C 05/28/19 2138    Terald Sleeper, MD 05/29/19 1123

## 2019-05-28 NOTE — ED Triage Notes (Addendum)
Pt c/o epigastric pains x 12 days  And bil leg pain x 1 year, interpretor used

## 2019-05-28 NOTE — ED Notes (Signed)
Patient transported to Ultrasound 

## 2019-05-28 NOTE — ED Notes (Signed)
Interpreter used for assessment

## 2019-05-28 NOTE — ED Provider Notes (Signed)
[facility] EMERGENCY DEPARTMENT Provider Note   CSN: [id number] Arrival date & time: 05/28/19  1702     History Chief Complaint  Patient presents with  . Generalized Body Aches    Samantha [name] is a 34 y.o. female.  Patient is a 34 year old female with past medical history of migraine headaches presenting to the emergency department for abdominal pain for 10 days and leg pain for 1 year.  Patient speaks Pash[place] and an interpreter was used to translate.  Patient reports that the leg pain started after she gave birth to her daughter 1 year ago and feels like it is in the muscles and is worse with movement.  She reports that the abdominal pain is epigastric and is worse with food and started 10 days ago.  She denies any fever, chills, nausea, vomiting, dysuria, diarrhea, Covid exposure, chest pain, shortness of breath.        Past Medical History:  Diagnosis Date  . Migraine     Patient Active Problem List   Diagnosis Date Noted  . Ureteral stone 04/01/2018  . Indication for care in labor or delivery 03/29/2018  . Back pain affecting pregnancy in third trimester 02/19/2018  . Language barrier 01/21/2018  . History of cesarean section 10/15/2017  . Supervision of other normal pregnancy, antepartum 09/10/2017  . Migraine without aura and without status migrainosus, not intractable 04/30/2017  . History of VBAC 07/30/2014    Past Surgical History:  Procedure Laterality Date  . CESAREAN SECTION       OB History    Gravida  3   Para  3   Term  3   Preterm      AB      Living  3     SAB      TAB      Ectopic      Multiple  0   Live Births  3           Family History  Problem Relation Age of Onset  . Asthma Father   . Migraines Mother     Social History   Tobacco Use  . Smoking status: Never Smoker  . Smokeless tobacco: Never Used  Substance Use Topics  . Alcohol use: No  . Drug use: No    Home Medications Prior to Admission  medications   Medication Sig Start Date End Date Taking? Authorizing Provider  cyclobenzaprine (FLEXERIL) 10 MG tablet Take 1 tablet (10 mg total) by mouth every 8 (eight) hours as needed for muscle spasms. 02/19/18   Rasch, Harolyn Rutherford, NP  Elastic Bandages & Supports (COMFORT FIT MATERNITY SUPP LG) MISC 1 each by Does not apply route daily as needed. Patient not taking: Reported on 12/16/2018 02/19/18   Rasch, Victorino Dike I, NP  ibuprofen (ADVIL,MOTRIN) 600 MG tablet Take 1 tablet (600 mg total) by mouth every 6 (six) hours. 04/01/18   Tamera Stands, DO  omeprazole (PRILOSEC) 20 MG capsule Take 20 mg by mouth daily.    [provider]  Prenatal Vit-Fe Fumarate-FA (PREPLUS) 27-1 MG TABS TAKE 1 TABLET BY MOUTH DAILY 09/12/18   Armando Reichert, CNM  propranolol (INDERAL) 20 MG tablet Take 1 tablet (20 mg total) by mouth 2 (two) times daily. 12/23/18   Lomax, Amy, NP    Allergies    Patient has no known allergies.  Review of Systems   Review of Systems  Constitutional: Negative for appetite change, chills and fever.  HENT: Negative  for congestion and sore throat.   Respiratory: Negative for cough and shortness of breath.   Gastrointestinal: Positive for abdominal pain. Negative for diarrhea, nausea and vomiting.  Genitourinary: Negative for dysuria.  Musculoskeletal: Positive for myalgias. Negative for arthralgias, back pain and gait problem.  Skin: Negative for rash and wound.  Neurological: Negative for dizziness and headaches.    Physical Exam Updated Vital Signs BP 124/77 (BP Location: Right Arm)   Pulse 76   Temp 97.8 F (36.6 C) (Oral)   Resp 16   Ht 5\' 4"  (1.626 m)   Wt 81 kg   LMP 05/07/2019   SpO2 100%   BMI 30.65 kg/m   Physical Exam Vitals and nursing note reviewed.  Constitutional:      General: She is not in acute distress.    Appearance: Normal appearance. She is not ill-appearing, toxic-appearing or diaphoretic.  HENT:     Head: Normocephalic.      Mouth/Throat:     Mouth: Mucous membranes are moist.  Eyes:     Conjunctiva/sclera: Conjunctivae normal.  Cardiovascular:     Rate and Rhythm: Normal rate and regular rhythm.  Pulmonary:     Effort: Pulmonary effort is normal.     Breath sounds: Normal breath sounds.  Abdominal:     General: Abdomen is flat. There is no distension.     Palpations: Abdomen is soft.     Tenderness: There is abdominal tenderness (epigastric). There is no rebound.  Skin:    General: Skin is dry.  Neurological:     Mental Status: She is alert.     Sensory: No sensory deficit.     Motor: No weakness.     Coordination: Coordination normal.     Gait: Gait normal.  Psychiatric:        Mood and Affect: Mood normal.     ED Results / Procedures / Treatments   Labs (all labs ordered are listed, but only abnormal results are displayed) Labs Reviewed  CBC WITH DIFFERENTIAL/PLATELET  COMPREHENSIVE METABOLIC PANEL  LIPASE, BLOOD  URINALYSIS, ROUTINE W REFLEX MICROSCOPIC  PREGNANCY, URINE    EKG None  Radiology No results found.  Procedures Procedures (including critical care time)  Medications Ordered in ED Medications  alum & mag hydroxide-simeth (MAALOX/MYLANTA) 200-200-20 MG/5ML suspension 30 mL (has no administration in time range)    And  lidocaine (XYLOCAINE) 2 % viscous mouth solution 15 mL (has no administration in time range)    ED Course  I have reviewed the triage vital signs and the nursing notes.  Pertinent labs & imaging results that were available during my care of the patient were reviewed by me and considered in my medical decision making (see chart for details).  Clinical Course as of May 27 1737  Wed May 28, 2019  1735 Patient presents emergency department for epigastric pain worse with eating for the past 10 days.  Afebrile and no associated symptoms.  She also would like relief of the leg pain that she has been having for the past 1 year.  It is bilateral and got worse  after she had a baby.  Patient is not having any saddle anesthesia, loss of control of bowel or bladder, fever.  I think your leg pain is probably musculoskeletal.  I am more concerned with her abdominal pain which may be related to gallbladder disease versus gastritis.  Will obtain labs, urinalysis, right upper quadrant ultrasound and start with a GI cocktail.   [KM]  1739 Patient care signed out to [facility] PA-C due to change of shift.   [KM]    Clinical Course User Index [KM] Kristine Royal   MDM Rules/Calculators/A&P                       Final Clinical Impression(s) / ED Diagnoses Final diagnoses:  Epigastric pain    Rx / DC Orders ED Discharge Orders    None       Kristine Royal 05/28/19 1739    Wyvonnia Dusky, MD 05/29/19 1122

## 2019-05-28 NOTE — ED Notes (Signed)
Explained to patient wait and no visitors by interpreter.

## 2019-06-18 ENCOUNTER — Ambulatory Visit: Payer: Self-pay | Admitting: Family Medicine

## 2019-06-18 ENCOUNTER — Encounter: Payer: Self-pay | Admitting: Family Medicine

## 2019-07-16 ENCOUNTER — Ambulatory Visit: Payer: Self-pay | Admitting: Family Medicine

## 2019-07-16 NOTE — Progress Notes (Deleted)
PATIENT: Samantha [name] DOB: 1986-02-14  REASON FOR VISIT: follow up HISTORY FROM: patient  No chief complaint on file.    HISTORY OF PRESENT ILLNESS: Today 07/16/19 Samantha [name] is a 34 y.o. female here today for follow up for migraines.   HISTORY: (copied from my note on 12/16/2018)  Samantha [name] is a 34 y.o. female here today for follow up for migraines. She was last seen in 2018 when Dr Lucia Gaskins started her on Effexor for prevention. She states that she took one tablet and did not like the way it made her feel. A few months later she became pregnant and was told by her OB/GYN not to start medication. She is 8 months post partum and headaches have returned. She reports nearly daily headaches with 2-3 of those being migrainous each week. She does have throbbing pain with light/sound sensitivity and nausea. She is taking ibuprofen OTC on a daily basis. She has 4 children. She is breastfeeding. She is hesitant to take any medications that could effect her baby.   HISTORY: (copied from Dr Trevor Mace note on 04/27/2017)  [id number] Khanis a 34 y.o.femalehere as a referral from Dr. Maryfrances Bunnell migrianes. She is here with an interpreter.PMHx depression, migraine.She has been non cpm[liant with medication, she did not take her Topamax every day and stopped it on her own. She only took a few and it didn;t help so she stopped. Explained that medication takes 8-12 weeks, she can;t stop it. She takes Excedrin which helps her headaches. Her Headaches are throbbing around the eyes, with light and sound sensitivity. She has had headaches for years. They have no changed in quality. She has migraines 4-5 days a month. She says she is headache free 25 days a month. She can take Excedrin as long as she takes it only 4-5 times a month. Discussed medication rebound, as long as she doesn't take it more than 10 days in a month. She has kids at home. She endorses depression, sadness, she is sad some most days  and she does cry. No vision changes, no paresthesias or weakness, no changes in quality, severity has improved from July, no red flags. Headaches started as child.   Reviewed notes, labs and imaging from outside physicians, which showed:  CMP unremarkable, tsh nml  Reviewed referring physician notes. Patient was seen multiple times in July September and October for headaches. Patient describing unilateral headaches almost daily, in the setting of stress or sadness, been taking Motrin or Aleve, which has caused GI upset and she is been to the emergency room multiple times in the last year or 2 for abdominal pain. She also describes light sensitivity, sound sensitivity, nausea. She was given Fioricet which did not improve symptoms. Headaches are throbbing and painful. At initial appointment the headaches were daily, in September she described 9-10 times per month. Taking Excedrin. In October she described them as 4 times per month. She was prescribed Topamax and Imitrex. Lying down in a dark room helps. Headaches are frontal. No associated visual disturbances, vomiting.   REVIEW OF SYSTEMS: Out of a complete 14 system review of symptoms, the patient complains only of the following symptoms, and all other reviewed systems are negative.  ALLERGIES: No Known Allergies  HOME MEDICATIONS: Outpatient Medications Prior to Visit  Medication Sig Dispense Refill  . cyclobenzaprine (FLEXERIL) 10 MG tablet Take 1 tablet (10 mg total) by mouth every 8 (eight) hours as needed for muscle spasms. 30 tablet 0  . Elastic Bandages &  Supports (COMFORT FIT MATERNITY SUPP LG) MISC 1 each by Does not apply route daily as needed. (Patient not taking: Reported on 12/16/2018) 1 each 0  . ibuprofen (ADVIL,MOTRIN) 600 MG tablet Take 1 tablet (600 mg total) by mouth every 6 (six) hours. 30 tablet 0  . methocarbamol (ROBAXIN) 500 MG tablet Take 1 tablet (500 mg total) by mouth 2 (two) times daily. 20 tablet 0  .  omeprazole (PRILOSEC) 20 MG capsule Take 20 mg by mouth daily.    . Prenatal Vit-Fe Fumarate-FA (PREPLUS) 27-1 MG TABS TAKE 1 TABLET BY MOUTH DAILY 30 tablet 11  . propranolol (INDERAL) 20 MG tablet Take 1 tablet (20 mg total) by mouth 2 (two) times daily. 60 tablet 5  . sucralfate (CARAFATE) 1 g tablet Take 1 tablet (1 g total) by mouth 4 (four) times daily -  with meals and at bedtime for 10 days. 40 tablet 0  . sucralfate (CARAFATE) 1 GM/10ML suspension Take 10 mLs (1 g total) by mouth 4 (four) times daily -  with meals and at bedtime. 420 mL 0   No facility-administered medications prior to visit.    PAST MEDICAL HISTORY: Past Medical History:  Diagnosis Date  . Migraine     PAST SURGICAL HISTORY: Past Surgical History:  Procedure Laterality Date  . CESAREAN SECTION      FAMILY HISTORY: Family History  Problem Relation Age of Onset  . Asthma Father   . Migraines Mother     SOCIAL HISTORY: Social History   Socioeconomic History  . Marital status: Married    Spouse name: Not on file  . Number of children: Not on file  . Years of education: Not on file  . Highest education level: Not on file  Occupational History  . Not on file  Tobacco Use  . Smoking status: Never Smoker  . Smokeless tobacco: Never Used  Substance and Sexual Activity  . Alcohol use: No  . Drug use: No  . Sexual activity: Yes    Birth control/protection: None  Other Topics Concern  . Not on file  Social History Narrative  . Not on file   Social Determinants of Health   Financial Resource Strain:   . Difficulty of Paying Living Expenses: Not on file  Food Insecurity:   . Worried About Programme researcher, broadcasting/film/video in the Last Year: Not on file  . Ran Out of Food in the Last Year: Not on file  Transportation Needs:   . Lack of Transportation (Medical): Not on file  . Lack of Transportation (Non-Medical): Not on file  Physical Activity:   . Days of Exercise per Week: Not on file  . Minutes of  Exercise per Session: Not on file  Stress:   . Feeling of Stress : Not on file  Social Connections:   . Frequency of Communication with Friends and Family: Not on file  . Frequency of Social Gatherings with Friends and Family: Not on file  . Attends Religious Services: Not on file  . Active Member of Clubs or Organizations: Not on file  . Attends Banker Meetings: Not on file  . Marital Status: Not on file  Intimate Partner Violence:   . Fear of Current or Ex-Partner: Not on file  . Emotionally Abused: Not on file  . Physically Abused: Not on file  . Sexually Abused: Not on file      PHYSICAL EXAM  There were no vitals filed for this visit. There is  no height or weight on file to calculate BMI.  Generalized: Well developed, in no acute distress  Cardiology: normal rate and rhythm, no murmur noted Neurological examination  Mentation: Alert oriented to time, place, history taking. Follows all commands speech and language fluent Cranial nerve II-XII: Pupils were equal round reactive to light. Extraocular movements were full, visual field were full on confrontational test. Facial sensation and strength were normal. Uvula tongue midline. Head turning and shoulder shrug  were normal and symmetric. Motor: The motor testing reveals 5 over 5 strength of all 4 extremities. Good symmetric motor tone is noted throughout.  Sensory: Sensory testing is intact to soft touch on all 4 extremities. No evidence of extinction is noted.  Coordination: Cerebellar testing reveals good finger-nose-finger and heel-to-shin bilaterally.  Gait and station: Gait is normal. Tandem gait is normal. Romberg is negative. No drift is seen.  Reflexes: Deep tendon reflexes are symmetric and normal bilaterally.   DIAGNOSTIC DATA (LABS, IMAGING, TESTING) - I reviewed patient records, labs, notes, testing and imaging myself where available.  No flowsheet data found.   Lab Results  Component Value Date     WBC 8.3 05/28/2019   HGB 13.5 05/28/2019   HCT 41.6 05/28/2019   MCV 90.0 05/28/2019   PLT 258 05/28/2019      Component Value Date/Time   NA 134 (L) 05/28/2019 1754   K 3.8 05/28/2019 1754   CL 105 05/28/2019 1754   CO2 24 05/28/2019 1754   GLUCOSE 136 (H) 05/28/2019 1754   GLUCOSE 78 09/03/2014 1434   BUN 20 05/28/2019 1754   CREATININE 0.70 05/28/2019 1754   CREATININE 0.70 11/30/2016 1555   CALCIUM 8.8 (L) 05/28/2019 1754   PROT 8.0 05/28/2019 1754   ALBUMIN 4.2 05/28/2019 1754   AST 26 05/28/2019 1754   ALT 30 05/28/2019 1754   ALKPHOS 64 05/28/2019 1754   BILITOT 0.5 05/28/2019 1754   GFRNONAA >60 05/28/2019 1754   GFRNONAA >89 11/30/2016 1555   GFRAA >60 05/28/2019 1754   GFRAA >89 11/30/2016 1555   No results found for: CHOL, HDL, LDLCALC, LDLDIRECT, TRIG, CHOLHDL Lab Results  Component Value Date   HGBA1C 5.1 01/03/2016   No results found for: [id number] Lab Results  Component Value Date   TSH 1.12 11/30/2016       ASSESSMENT AND PLAN 35 y.o. year old female  has a past medical history of Migraine. here with ***    ICD-10-CM   1. Migraine without aura and without status migrainosus, not intractable  G43.009        No orders of the defined types were placed in this encounter.    No orders of the defined types were placed in this encounter.     I spent 15 minutes with the patient. 50% of this time was spent counseling and educating patient on plan of care and medications.    Debbora Presto, FNP-C 07/16/2019, 7:25 AM [facility] [address] [place], [place] [address] [phone]

## 2019-08-04 ENCOUNTER — Ambulatory Visit: Payer: Self-pay | Admitting: Family Medicine

## 2019-08-12 ENCOUNTER — Encounter: Payer: Self-pay | Admitting: Family Medicine

## 2019-08-12 ENCOUNTER — Ambulatory Visit: Payer: No Typology Code available for payment source | Admitting: Family Medicine

## 2019-08-12 ENCOUNTER — Other Ambulatory Visit: Payer: Self-pay

## 2019-08-12 VITALS — BP 125/81 | HR 78 | Temp 97.6°F | Ht 61.0 in | Wt 178.0 lb

## 2019-08-12 DIAGNOSIS — G43009 Migraine without aura, not intractable, without status migrainosus: Secondary | ICD-10-CM

## 2019-08-12 MED ORDER — TOPIRAMATE 25 MG PO TABS: 25.0000 mg | ORAL_TABLET | Freq: Two times a day (BID) | ORAL | 3 refills | Status: AC

## 2019-08-12 MED ORDER — RIZATRIPTAN BENZOATE 10 MG PO TBDP: 10.0000 mg | ORAL_TABLET | ORAL | 11 refills | Status: AC | PRN

## 2019-08-12 NOTE — Patient Instructions (Addendum)
We will start topiramate 25mg  twice daily to prevent headaches. PLEASE DO NOT BECOME PREGNANT ON THIS MEDICATION.   We will start rizatriptan to treat a migraine as needed. ONLY TAKE AS NEEDED FOR BAD MIGRAINE. DO NOT TAKE MORE THAN 1-2 TIMES WEEKLY.   Follow up in 6 months.   Topiramate tablets What is this medicine? TOPIRAMATE (toe PYRE a mate) is used to treat seizures in adults or children with epilepsy. It is also used for the prevention of migraine headaches. This medicine may be used for other purposes; ask your health care provider or pharmacist if you have questions. COMMON BRAND NAME(S): Topamax, Topiragen What should I tell my health care provider before I take this medicine? They need to know if you have any of these conditions:  bleeding disorders  kidney disease  lung or breathing disease, like asthma  suicidal thoughts, plans, or attempt; a previous suicide attempt by you or a family member  an unusual or allergic reaction to topiramate, other medicines, foods, dyes, or preservatives  pregnant or trying to get pregnant  breast-feeding How should I use this medicine? Take this medicine by mouth with a glass of water. Follow the directions on the prescription label. Do not cut, crush or chew this medicine. Swallow the tablets whole. You can take it with or without food. If it upsets your stomach, take it with food. Take your medicine at regular intervals. Do not take it more often than directed. Do not stop taking except on your doctor's advice. A special MedGuide will be given to you by the pharmacist with each prescription and refill. Be sure to read this information carefully each time. Talk to your pediatrician regarding the use of this medicine in children. While this drug may be prescribed for children as young as 23 years of age for selected conditions, precautions do apply. Overdosage: If you think you have taken too much of this medicine contact a poison  control center or emergency room at once. NOTE: This medicine is only for you. Do not share this medicine with others. What if I miss a dose? If you miss a dose, take it as soon as you can. If your next dose is to be taken in less than 6 hours, then do not take the missed dose. Take the next dose at your regular time. Do not take double or extra doses. What may interact with this medicine? This medicine may interact with the following medications:  acetazolamide  alcohol  antihistamines for allergy, cough, and cold  aspirin and aspirin-like medicines  atropine  birth control pills  certain medicines for anxiety or sleep  certain medicines for bladder problems like oxybutynin, tolterodine  certain medicines for depression like amitriptyline, fluoxetine, sertraline  certain medicines for seizures like carbamazepine, phenobarbital, phenytoin, primidone, valproic acid, zonisamide  certain medicines for stomach problems like dicyclomine, hyoscyamine  certain medicines for travel sickness like scopolamine  certain medicines for Parkinson's disease like benztropine, trihexyphenidyl  certain medicines that treat or prevent blood clots like warfarin, enoxaparin, dalteparin, apixaban, dabigatran, and rivaroxaban  digoxin  general anesthetics like halothane, isoflurane, methoxyflurane, propofol  hydrochlorothiazide  ipratropium  lithium  medicines that relax muscles for surgery  metformin  narcotic medicines for pain  NSAIDs, medicines for pain and inflammation, like ibuprofen or naproxen  phenothiazines like chlorpromazine, mesoridazine, prochlorperazine, thioridazine  pioglitazone This list may not describe all possible interactions. Give your health care provider a list of all the medicines, herbs, non-prescription drugs, or  dietary supplements you use. Also tell them if you smoke, drink alcohol, or use illegal drugs. Some items may interact with your medicine. What  should I watch for while using this medicine? Visit your doctor or health care professional for regular checks on your progress. Tell your health care professional if your symptoms do not start to get better or if they get worse. Do not stop taking except on your health care professional's advice. You may develop a severe reaction. Your health care professional will tell you how much medicine to take. Wear a medical ID bracelet or chain. Carry a card that describes your disease and details of your medicine and dosage times. This medicine can reduce the response of your body to heat or cold. Dress warm in cold weather and stay hydrated in hot weather. If possible, avoid extreme temperatures like saunas, hot tubs, very hot or cold showers, or activities that can cause dehydration such as vigorous exercise. Check with your health care professional if you have severe diarrhea, nausea, and vomiting, or if you sweat a lot. The loss of too much body fluid may make it dangerous for you to take this medicine. You may get drowsy or dizzy. Do not drive, use machinery, or do anything that needs mental alertness until you know how this medicine affects you. Do not stand up or sit up quickly, especially if you are an older patient. This reduces the risk of dizzy or fainting spells. Alcohol may interfere with the effect of this medicine. Avoid alcoholic drinks. Tell your health care professional right away if you have any change in your eyesight. Patients and their families should watch out for new or worsening depression or thoughts of suicide. Also watch out for sudden changes in feelings such as feeling anxious, agitated, panicky, irritable, hostile, aggressive, impulsive, severely restless, overly excited and hyperactive, or not being able to sleep. If this happens, especially at the beginning of treatment or after a change in dose, call your healthcare professional. This medicine may cause serious skin reactions.  They can happen weeks to months after starting the medicine. Contact your health care provider right away if you notice fevers or flu-like symptoms with a rash. The rash may be red or purple and then turn into blisters or peeling of the skin. Or, you might notice a red rash with swelling of the face, lips or lymph nodes in your neck or under your arms. Birth control may not work properly while you are taking this medicine. Talk to your health care professional about using an extra method of birth control. Women should inform their health care professional if they wish to become pregnant or think they might be pregnant. There is a potential for serious side effects and harm to an unborn child. Talk to your health care professional for more information. What side effects may I notice from receiving this medicine? Side effects that you should report to your doctor or health care professional as soon as possible:  allergic reactions like skin rash, itching or hives, swelling of the face, lips, or tongue  blood in the urine  changes in vision  confusion  loss of memory  pain in lower back or side  pain when urinating  redness, blistering, peeling or loosening of the skin, including inside the mouth  signs and symptoms of bleeding such as bloody or black, tarry stools; red or dark brown urine; spitting up blood or brown material that looks like coffee grounds; red spots on  the skin; unusual bruising or bleeding from the eyes, gums, or nose  signs and symptoms of increased acid in the body like breathing fast; fast heartbeat; headache; confusion; unusually weak or tired; nausea, vomiting  suicidal thoughts, mood changes  trouble speaking or understanding  unusual sweating  unusually weak or tired Side effects that usually do not require medical attention (report to your doctor or health care professional if they continue or are bothersome):  dizziness  drowsiness  fever  loss of  appetite  nausea, vomiting  pain, tingling, numbness in the hands or feet  stomach pain  tiredness  upset stomach This list may not describe all possible side effects. Call your doctor for medical advice about side effects. You may report side effects to FDA at 1-800-FDA-1088. Where should I keep my medicine? Keep out of the reach of children. Store at room temperature between 15 and 30 degrees C (59 and 86 degrees F). Throw away any unused medicine after the expiration date. NOTE: This sheet is a summary. It may not cover all possible information. If you have questions about this medicine, talk to your doctor, pharmacist, or health care provider.  2020 Elsevier/Gold Standard (2018-11-28 15:07:20)   Rizatriptan tablets What is this medicine? RIZATRIPTAN (rye za TRIP tan) is used to treat migraines with or without aura. An aura is a strange feeling or visual disturbance that warns you of an attack. It is not used to prevent migraines. This medicine may be used for other purposes; ask your health care provider or pharmacist if you have questions. COMMON BRAND NAME(S): Maxalt What should I tell my health care provider before I take this medicine? They need to know if you have any of these conditions:  cigarette smoker  circulation problems in fingers and toes  diabetes  heart disease  high blood pressure  high cholesterol  history of irregular heartbeat  history of stroke  kidney disease  liver disease  stomach or intestine problems  an unusual or allergic reaction to rizatriptan, other medicines, foods, dyes, or preservatives  pregnant or trying to get pregnant  breast-feeding How should I use this medicine? Take this medicine by mouth with a glass of water. Follow the directions on the prescription label. Do not take it more often than directed. Talk to your pediatrician regarding the use of this medicine in children. While this drug may be prescribed for  children as young as 6 years for selected conditions, precautions do apply. Overdosage: If you think you have taken too much of this medicine contact a poison control center or emergency room at once. NOTE: This medicine is only for you. Do not share this medicine with others. What if I miss a dose? This does not apply. This medicine is not for regular use. What may interact with this medicine? Do not take this medicine with any of the following medicines:  certain medicines for migraine headache like almotriptan, eletriptan, frovatriptan, naratriptan, rizatriptan, sumatriptan, zolmitriptan  ergot alkaloids like dihydroergotamine, ergonovine, ergotamine, methylergonovine  MAOIs like Carbex, Eldepryl, Marplan, Nardil, and Parnate This medicine may also interact with the following medications:  certain medicines for depression, anxiety, or psychotic disorders  propranolol This list may not describe all possible interactions. Give your health care provider a list of all the medicines, herbs, non-prescription drugs, or dietary supplements you use. Also tell them if you smoke, drink alcohol, or use illegal drugs. Some items may interact with your medicine. What should I watch for while using this  medicine? Visit your healthcare professional for regular checks on your progress. Tell your healthcare professional if your symptoms do not start to get better or if they get worse. You may get drowsy or dizzy. Do not drive, use machinery, or do anything that needs mental alertness until you know how this medicine affects you. Do not stand up or sit up quickly, especially if you are an older patient. This reduces the risk of dizzy or fainting spells. Alcohol may interfere with the effect of this medicine. Your mouth may get dry. Chewing sugarless gum or sucking hard candy and drinking plenty of water may help. Contact your healthcare professional if the problem does not go away or is severe. If you take  migraine medicines for 10 or more days a month, your migraines may get worse. Keep a diary of headache days and medicine use. Contact your healthcare professional if your migraine attacks occur more frequently. What side effects may I notice from receiving this medicine? Side effects that you should report to your doctor or health care professional as soon as possible:  allergic reactions like skin rash, itching or hives, swelling of the face, lips, or tongue  chest pain or chest tightness  signs and symptoms of a dangerous change in heartbeat or heart rhythm like chest pain; dizziness; fast, irregular heartbeat; palpitations; feeling faint or lightheaded; falls; breathing problems  signs and symptoms of a stroke like changes in vision; confusion; trouble speaking or understanding; severe headaches; sudden numbness or weakness of the face, arm or leg; trouble walking; dizziness; loss of balance or coordination  signs and symptoms of serotonin syndrome like irritable; confusion; diarrhea; fast or irregular heartbeat; muscle twitching; stiff muscles; trouble walking; sweating; high fever; seizures; chills; vomiting Side effects that usually do not require medical attention (report to your doctor or health care professional if they continue or are bothersome):  diarrhea  dizziness  drowsiness  dry mouth  headache  nausea, vomiting  pain, tingling, numbness in the hands or feet  stomach pain This list may not describe all possible side effects. Call your doctor for medical advice about side effects. You may report side effects to FDA at 1-800-FDA-1088. Where should I keep my medicine? Keep out of the reach of children. Store at room temperature between 15 and 30 degrees C (59 and 86 degrees F). Keep container tightly closed. Throw away any unused medicine after the expiration date. NOTE: This sheet is a summary. It may not cover all possible information. If you have questions about this  medicine, talk to your doctor, pharmacist, or health care provider.  2020 Elsevier/Gold Standard (2017-11-13 14:59:59)    Migraine Headache A migraine headache is a very strong throbbing pain on one side or both sides of your head. This type of headache can also cause other symptoms. It can last from 4 hours to 3 days. Talk with your doctor about what things may bring on (trigger) this condition. What are the causes? The exact cause of this condition is not known. This condition may be triggered or caused by:  Drinking alcohol.  Smoking.  Taking medicines, such as: ? Medicine used to treat chest pain (nitroglycerin). ? Birth control pills. ? Estrogen. ? Some blood pressure medicines.  Eating or drinking certain products.  Doing physical activity. Other things that may trigger a migraine headache include:  Having a menstrual period.  Pregnancy.  Hunger.  Stress.  Not getting enough sleep or getting too much sleep.  Weather changes.  Tiredness (fatigue). What increases the risk?  Being 97-25 years old.  Being female.  Having a family history of migraine headaches.  Being Caucasian.  Having depression or anxiety.  Being very overweight. What are the signs or symptoms?  A throbbing pain. This pain may: ? Happen in any area of the head, such as on one side or both sides. ? Make it hard to do daily activities. ? Get worse with physical activity. ? Get worse around bright lights or loud noises.  Other symptoms may include: ? Feeling sick to your stomach (nauseous). ? Vomiting. ? Dizziness. ? Being sensitive to bright lights, loud noises, or smells.  Before you get a migraine headache, you may get warning signs (an aura). An aura may include: ? Seeing flashing lights or having blind spots. ? Seeing bright spots, halos, or zigzag lines. ? Having tunnel vision or blurred vision. ? Having numbness or a tingling feeling. ? Having trouble talking. ? Having  weak muscles.  Some people have symptoms after a migraine headache (postdromal phase), such as: ? Tiredness. ? Trouble thinking (concentrating). How is this treated?  Taking medicines that: ? Relieve pain. ? Relieve the feeling of being sick to your stomach. ? Prevent migraine headaches.  Treatment may also include: ? Having acupuncture. ? Avoiding foods that bring on migraine headaches. ? Learning ways to control your body functions (biofeedback). ? Therapy to help you know and deal with negative thoughts (cognitive behavioral therapy). Follow these instructions at home: Medicines  Take over-the-counter and prescription medicines only as told by your doctor.  Ask your doctor if the medicine prescribed to you: ? Requires you to avoid driving or using heavy machinery. ? Can cause trouble pooping (constipation). You may need to take these steps to prevent or treat trouble pooping:  Drink enough fluid to keep your pee (urine) pale yellow.  Take over-the-counter or prescription medicines.  Eat foods that are high in fiber. These include beans, whole grains, and fresh fruits and vegetables.  Limit foods that are high in fat and sugar. These include fried or sweet foods. Lifestyle  Do not drink alcohol.  Do not use any products that contain nicotine or tobacco, such as cigarettes, e-cigarettes, and chewing tobacco. If you need help quitting, ask your doctor.  Get at least 8 hours of sleep every night.  Limit and deal with stress. General instructions      Keep a journal to find out what may bring on your migraine headaches. For example, write down: ? What you eat and drink. ? How much sleep you get. ? Any change in what you eat or drink. ? Any change in your medicines.  If you have a migraine headache: ? Avoid things that make your symptoms worse, such as bright lights. ? It may help to lie down in a dark, quiet room. ? Do not drive or use heavy machinery. ? Ask  your doctor what activities are safe for you.  Keep all follow-up visits as told by your doctor. This is important. Contact a doctor if:  You get a migraine headache that is different or worse than others you have had.  You have more than 15 headache days in one month. Get help right away if:  Your migraine headache gets very bad.  Your migraine headache lasts longer than 72 hours.  You have a fever.  You have a stiff neck.  You have trouble seeing.  Your muscles feel weak or like you cannot control  them.  You start to lose your balance a lot.  You start to have trouble walking.  You pass out (faint).  You have a seizure. Summary  A migraine headache is a very strong throbbing pain on one side or both sides of your head. These headaches can also cause other symptoms.  This condition may be treated with medicines and changes to your lifestyle.  Keep a journal to find out what may bring on your migraine headaches.  Contact a doctor if you get a migraine headache that is different or worse than others you have had.  Contact your doctor if you have more than 15 headache days in a month. This information is not intended to replace advice given to you by your health care provider. Make sure you discuss any questions you have with your health care provider. Document Revised: 08/23/2018 Document Reviewed: 06/13/2018 Elsevier Patient Education  [place].

## 2019-08-12 NOTE — Progress Notes (Addendum)
PATIENT: Samantha [name] DOB: 22-Oct-1985  REASON FOR VISIT: follow up HISTORY FROM: patient  Chief Complaint  Patient presents with  . Follow-up    pt with husband, rm 1. pt states that she will take advil or migraine for the pain. sometimes complains of tension in the neck and muscles.      HISTORY OF PRESENT ILLNESS: Today 08/12/19 Samantha [name] is a 34 y.o. female here today for follow up for migraines. We started propranolol for prevention at last visit as she was breastfeeding. She took this for about 1 month but states it was not effective. No adverse effect. Headaches continue. Migraines occur 3-4 times a week. She is no longer breastfeeding. Her husband reports they do not wish to have any more children. She is not currently on birth control but they use condoms with intercourse.     HISTORY: (copied from my note on 12/16/2018)  Samantha [name] is a 34 y.o. female here today for follow up for migraines. She was last seen in 2018 when Dr Lucia Gaskins started her on Effexor for prevention. She states that she took one tablet and did not like the way it made her feel. A few months later she became pregnant and was told by her OB/GYN not to start medication. She is 8 months post partum and headaches have returned. She reports nearly daily headaches with 2-3 of those being migrainous each week. She does have throbbing pain with light/sound sensitivity and nausea. She is taking ibuprofen OTC on a daily basis. She has 4 children. She is breastfeeding. She is hesitant to take any medications that could effect her baby.   HISTORY: (copied from Dr Trevor Mace note on 04/27/2017)  [id number] Samantha [name] a 34 y.o.femalehere as a referral from Dr. Maryfrances Bunnell migrianes. She is here with an interpreter.PMHx depression, migraine.She has been non cpm[liant with medication, she did not take her Topamax every day and stopped it on her own. She only took a few and it didn;t help so she stopped. Explained that  medication takes 8-12 weeks, she can;t stop it. She takes Excedrin which helps her headaches. Her Headaches are throbbing around the eyes, with light and sound sensitivity. She has had headaches for years. They have no changed in quality. She has migraines 4-5 days a month. She says she is headache free 25 days a month. She can take Excedrin as long as she takes it only 4-5 times a month. Discussed medication rebound, as long as she doesn't take it more than 10 days in a month. She has kids at home. She endorses depression, sadness, she is sad some most days and she does cry. No vision changes, no paresthesias or weakness, no changes in quality, severity has improved from July, no red flags. Headaches started as child.   Reviewed notes, labs and imaging from outside physicians, which showed:  CMP unremarkable, tsh nml  Reviewed referring physician notes. Patient was seen multiple times in July September and October for headaches. Patient describing unilateral headaches almost daily, in the setting of stress or sadness, been taking Motrin or Aleve, which has caused GI upset and she is been to the emergency room multiple times in the last year or 2 for abdominal pain. She also describes light sensitivity, sound sensitivity, nausea. She was given Fioricet which did not improve symptoms. Headaches are throbbing and painful. At initial appointment the headaches were daily, in September she described 9-10 times per month. Taking Excedrin. In October she described them as 4  times per month. She was prescribed Topamax and Imitrex. Lying down in a dark room helps. Headaches are frontal. No associated visual disturbances, vomiting.   REVIEW OF SYSTEMS: Out of a complete 14 system review of symptoms, the patient complains only of the following symptoms, headaches and all other reviewed systems are negative.   ALLERGIES: No Known Allergies  HOME MEDICATIONS: Outpatient Medications Prior to Visit   Medication Sig Dispense Refill  . Elastic Bandages & Supports (COMFORT FIT MATERNITY SUPP LG) MISC 1 each by Does not apply route daily as needed. 1 each 0  . ibuprofen (ADVIL,MOTRIN) 600 MG tablet Take 1 tablet (600 mg total) by mouth every 6 (six) hours. 30 tablet 0  . methocarbamol (ROBAXIN) 500 MG tablet Take 1 tablet (500 mg total) by mouth 2 (two) times daily. 20 tablet 0  . omeprazole (PRILOSEC) 20 MG capsule Take 20 mg by mouth daily.    . Prenatal Vit-Fe Fumarate-FA (PREPLUS) 27-1 MG TABS TAKE 1 TABLET BY MOUTH DAILY 30 tablet 11  . sucralfate (CARAFATE) 1 GM/10ML suspension Take 10 mLs (1 g total) by mouth 4 (four) times daily -  with meals and at bedtime. 420 mL 0  . propranolol (INDERAL) 20 MG tablet Take 1 tablet (20 mg total) by mouth 2 (two) times daily. (Patient not taking: Reported on 08/12/2019) 60 tablet 5  . sucralfate (CARAFATE) 1 g tablet Take 1 tablet (1 g total) by mouth 4 (four) times daily -  with meals and at bedtime for 10 days. 40 tablet 0  . cyclobenzaprine (FLEXERIL) 10 MG tablet Take 1 tablet (10 mg total) by mouth every 8 (eight) hours as needed for muscle spasms. 30 tablet 0   No facility-administered medications prior to visit.    PAST MEDICAL HISTORY: Past Medical History:  Diagnosis Date  . Migraine     PAST SURGICAL HISTORY: Past Surgical History:  Procedure Laterality Date  . CESAREAN SECTION      FAMILY HISTORY: Family History  Problem Relation Age of Onset  . Asthma Father   . Migraines Mother     SOCIAL HISTORY: Social History   Socioeconomic History  . Marital status: Married    Spouse name: Not on file  . Number of children: Not on file  . Years of education: Not on file  . Highest education level: Not on file  Occupational History  . Not on file  Tobacco Use  . Smoking status: Never Smoker  . Smokeless tobacco: Never Used  Substance and Sexual Activity  . Alcohol use: No  . Drug use: No  . Sexual activity: Yes    Birth  control/protection: None  Other Topics Concern  . Not on file  Social History Narrative  . Not on file   Social Determinants of Health   Financial Resource Strain:   . Difficulty of Paying Living Expenses:   Food Insecurity:   . Worried About Charity fundraiser in the Last Year:   . Arboriculturist in the Last Year:   Transportation Needs:   . Film/video editor (Medical):   Marland Kitchen Lack of Transportation (Non-Medical):   Physical Activity:   . Days of Exercise per Week:   . Minutes of Exercise per Session:   Stress:   . Feeling of Stress :   Social Connections:   . Frequency of Communication with Friends and Family:   . Frequency of Social Gatherings with Friends and Family:   . Attends Religious Services:   .  Active Member of Clubs or Organizations:   . Attends Banker Meetings:   Marland Kitchen Marital Status:   Intimate Partner Violence:   . Fear of Current or Ex-Partner:   . Emotionally Abused:   Marland Kitchen Physically Abused:   . Sexually Abused:       PHYSICAL EXAM  Vitals:   08/12/19 0820  BP: 125/81  Pulse: 78  Temp: 97.6 F (36.4 C)  Weight: 178 lb (80.7 kg)  Height: 5\' 1"  (1.549 m)   Body mass index is 33.63 kg/m.  Generalized: Well developed, in no acute distress  Neurological examination  Mentation: Alert oriented to time, place, history taking. Follows all commands speech and language fluent Cranial nerve II-XII: Pupils were equal round reactive to light. Extraocular movements were full, visual field were full  Motor: The motor testing reveals 5 over 5 strength of all 4 extremities. Good symmetric motor tone is noted throughout.  Gait and station: Gait is normal.   DIAGNOSTIC DATA (LABS, IMAGING, TESTING) - I reviewed patient records, labs, notes, testing and imaging myself where available.  No flowsheet data found.   Lab Results  Component Value Date   WBC 8.3 05/28/2019   HGB 13.5 05/28/2019   HCT 41.6 05/28/2019   MCV 90.0 05/28/2019   PLT  258 05/28/2019      Component Value Date/Time   NA 134 (L) 05/28/2019 1754   K 3.8 05/28/2019 1754   CL 105 05/28/2019 1754   CO2 24 05/28/2019 1754   GLUCOSE 136 (H) 05/28/2019 1754   GLUCOSE 78 09/03/2014 1434   BUN 20 05/28/2019 1754   CREATININE 0.70 05/28/2019 1754   CREATININE 0.70 11/30/2016 1555   CALCIUM 8.8 (L) 05/28/2019 1754   PROT 8.0 05/28/2019 1754   ALBUMIN 4.2 05/28/2019 1754   AST 26 05/28/2019 1754   ALT 30 05/28/2019 1754   ALKPHOS 64 05/28/2019 1754   BILITOT 0.5 05/28/2019 1754   GFRNONAA >60 05/28/2019 1754   GFRNONAA >89 11/30/2016 1555   GFRAA >60 05/28/2019 1754   GFRAA >89 11/30/2016 1555   No results found for: CHOL, HDL, LDLCALC, LDLDIRECT, TRIG, CHOLHDL Lab Results  Component Value Date   HGBA1C 5.1 01/03/2016   No results found for: 01/05/2016 Lab Results  Component Value Date   TSH 1.12 11/30/2016       ASSESSMENT AND PLAN 34 y.o. year old female  has a past medical history of Migraine. here with     ICD-10-CM   1. Migraine without aura and without status migrainosus, not intractable  G43.009     Jolly continues to have near daily headaches with 12-16 migrainous headaches each month. Propranolol was ineffective. We will start topiramate 25mg  twice daily. She was advised of potential side effects of this medication. She was also advised not to become pregnant while taking this medication as there are increased risks of birth defects. She will continue to use condoms with each sexual encounter and notify me immediately for any concerns of pregnancy. We will start rizatriptan as needed for abortive therapy. She was educated on appropriate administration of this medication. She will avoid taking OTC analgesics on regular basis. Adequate hydration and healthy lifestyle habits advised. She will follow up in 6 months, sooner if needed. She and her husband verbalize understanding and agreement with this plan.    No orders of the defined types  were placed in this encounter.    Meds ordered this encounter  Medications  . topiramate (TOPAMAX) 25  MG tablet    Sig: Take 1 tablet (25 mg total) by mouth 2 (two) times daily.    Dispense:  180 tablet    Refill:  3    Order Specific Question:   Supervising Provider    Answer:   Anson Fret [id number]  . rizatriptan (MAXALT-MLT) 10 MG disintegrating tablet    Sig: Take 1 tablet (10 mg total) by mouth as needed for migraine. May repeat in 2 hours if needed    Dispense:  9 tablet    Refill:  11    Order Specific Question:   Supervising Provider    Answer:   Anson Fret [id number]      I spent 35 minutes with the patient. 50% of this time was spent counseling and educating patient on plan of care and medications.    Shawnie Dapper, FNP-C 08/12/2019, 9:01 AM [facility] [address] [place], [place] [address] [phone]  Made any corrections needed, and agree with history, physical, neuro exam,assessment and plan as stated.     Naomie Dean, MD [facility]

## 2019-08-13 ENCOUNTER — Telehealth: Payer: Self-pay | Admitting: Family Medicine

## 2019-08-13 NOTE — Telephone Encounter (Signed)
It is hard to assess response with just one dose of topiramate. She has had similar concerns with several other preventatives but has only taken one dose. If she can tolerate grogginess and tolerating well otherwise, I would recommend continuing topiramate 25mg  daily for 1 week then increase to twice daily dosing.

## 2019-08-13 NOTE — Telephone Encounter (Signed)
LMVM for husband to return call.  

## 2019-08-13 NOTE — Telephone Encounter (Signed)
Patient husband called to report patient has been feeling drowzy while taking the topiramate (TOPAMAX) 25 MG tablet states patient wakes up and still feels sleepy and would like to know what other options the patient has

## 2019-08-14 NOTE — Telephone Encounter (Signed)
Stop the topiramate and wait a few days to ensure she is improved. Then can discuss with amy next week.

## 2019-08-14 NOTE — Telephone Encounter (Signed)
Husband called back and pt has SE of grogginess, sleepiness, dizziness, c/o body pain when taking the topiramate this with one tablet at night for the 2 nights she took this.   Also the rizatriptan was $200 for 9 tabs.   Please advise.

## 2019-08-14 NOTE — Telephone Encounter (Signed)
I relayed to husband. Pt to stop taking for the next couple of days and return to her baseline.  Speak to amy next week after Tuesday to see what else she would recommend. He verbalized understanding.

## 2019-08-18 NOTE — Telephone Encounter (Signed)
Please let them know that I am happy to try another preventative. I am very concerned as she has tried topiramate, propranolol, Effexor, and Imitrex in the past and reported side effects with all of these medications. We can try amitriptyline 25mg  at bedtime but this will most likely make her sleepy. I am very hesitant of levetiracetam or divalproex due to her concerns of depression. Our other issue is she is not insured. Any medication I write could be expensive. Rizatriptan is usually one of the cheaper options. She can try to call around to different pharmacies and try looking on Good RX (medication is listed anywhere from $18-25).

## 2019-08-19 MED ORDER — AMITRIPTYLINE HCL 10 MG PO TABS: 10.0000 mg | ORAL_TABLET | Freq: Every day | ORAL | 11 refills | Status: AC

## 2019-08-19 NOTE — Telephone Encounter (Signed)
I called husband.  I relayed that per AL/NP since topiramate SE, she offered amitriptyline 25mg  po qhs which will cause drowsiness (take daily as a preventative), the other rizatriptan, explained rescue drug 2 max daily for migraine.  Goodrx is option for them.  He was familiar with this application.  He will let know.  Wanted prescription for the other to [facility].  Pt is breast feeding.

## 2019-08-24 ENCOUNTER — Other Ambulatory Visit: Payer: Self-pay

## 2019-08-24 ENCOUNTER — Emergency Department (HOSPITAL_BASED_OUTPATIENT_CLINIC_OR_DEPARTMENT_OTHER)
Admission: EM | Admit: 2019-08-24 | Discharge: 2019-08-24 | Disposition: A | Discharge status: Home / Self Care | Payer: No Typology Code available for payment source | Attending: Emergency Medicine | Admitting: Emergency Medicine

## 2019-08-24 ENCOUNTER — Encounter (HOSPITAL_BASED_OUTPATIENT_CLINIC_OR_DEPARTMENT_OTHER): Payer: Self-pay | Admitting: *Deleted

## 2019-08-24 DIAGNOSIS — Z79899 Other long term (current) drug therapy: Secondary | ICD-10-CM | POA: Insufficient documentation

## 2019-08-24 DIAGNOSIS — H1013 Acute atopic conjunctivitis, bilateral: Secondary | ICD-10-CM

## 2019-08-24 DIAGNOSIS — J3489 Other specified disorders of nose and nasal sinuses: Secondary | ICD-10-CM | POA: Insufficient documentation

## 2019-08-24 DIAGNOSIS — G43809 Other migraine, not intractable, without status migrainosus: Secondary | ICD-10-CM | POA: Insufficient documentation

## 2019-08-24 DIAGNOSIS — H5789 Other specified disorders of eye and adnexa: Secondary | ICD-10-CM | POA: Diagnosis present

## 2019-08-24 DIAGNOSIS — M79604 Pain in right leg: Secondary | ICD-10-CM | POA: Insufficient documentation

## 2019-08-24 DIAGNOSIS — M79605 Pain in left leg: Secondary | ICD-10-CM | POA: Insufficient documentation

## 2019-08-24 MED ORDER — FLUORESCEIN SODIUM 1 MG OP STRP
ORAL_STRIP | OPHTHALMIC | Status: AC
  Filled 2019-08-24: qty 1

## 2019-08-24 MED ORDER — TETRACAINE HCL 0.5 % OP SOLN
OPHTHALMIC | Status: AC
  Filled 2019-08-24: qty 4

## 2019-08-24 MED ORDER — OLOPATADINE HCL 0.1 % OP SOLN: 1.0000 [drp] | Freq: Two times a day (BID) | OPHTHALMIC | 12 refills | Status: AC

## 2019-08-24 NOTE — ED Provider Notes (Signed)
[facility] EMERGENCY DEPARTMENT Provider Note   CSN: [id number] Arrival date & time: 08/24/19  1945     History Chief Complaint  Patient presents with  . Leg Pain  . Foreign Body in Eye    Samantha [name] is a 34 y.o. female with PMHx migraines who presents to the ED today with multiple complaints.   She reports that she was mowing thePt reports she was mowing the grass the other day. Shortly afterwards she began having itching to her bilateral eyes. She also reports rhinorrhea since then. She has been taking OTC allergy medication without relief. She denies any eye pain or redness. No FB sensation despite triage report stating this. No blurry vision or double vision.   Pt also complains of intermittent bilateral leg pain that occurs mostly after she has been on her feet all day. Husband reports that pt stands quite frequently doing the dishes or cooking in the kitchen. She does not typically take anything for pain as it dissipates once she rests. She denies unilateral leg pain or swelling, chest pain, or SOB.   Husband also mentions pt's history of migraine headaches. She is currently being evaluated by neurology at this time and has been prescribed Amitryptiline. He reports that they are concerned pt could have a tumor of some kind and would like imaging done. They do mention that neurology wanted them to try this medication before talking about any imaging as they did not think it was necessary due to pt's symptoms. Pt denies vision changes, neck stiffness, rash, unilateral weakness or numbness, confusion, speech changes.   The history is provided by the patient and the spouse.       Past Medical History:  Diagnosis Date  . Migraine     Patient Active Problem List   Diagnosis Date Noted  . Ureteral stone 04/01/2018  . Indication for care in labor or delivery 03/29/2018  . Back pain affecting pregnancy in third trimester 02/19/2018  . Language barrier 01/21/2018  .  History of cesarean section 10/15/2017  . Supervision of other normal pregnancy, antepartum 09/10/2017  . Migraine without aura and without status migrainosus, not intractable 04/30/2017  . History of VBAC 07/30/2014    Past Surgical History:  Procedure Laterality Date  . CESAREAN SECTION       OB History    Gravida  3   Para  3   Term  3   Preterm      AB      Living  3     SAB      TAB      Ectopic      Multiple  0   Live Births  3           Family History  Problem Relation Age of Onset  . Asthma Father   . Migraines Mother     Social History   Tobacco Use  . Smoking status: Never Smoker  . Smokeless tobacco: Never Used  Substance Use Topics  . Alcohol use: No  . Drug use: No    Home Medications Prior to Admission medications   Medication Sig Start Date End Date Taking? Authorizing Provider  amitriptyline (ELAVIL) 10 MG tablet Take 1 tablet (10 mg total) by mouth at bedtime. 08/19/19   Lomax, Amy, NP  Elastic Bandages & Supports (COMFORT FIT MATERNITY SUPP LG) MISC 1 each by Does not apply route daily as needed. 02/19/18   Rasch, Harolyn Rutherford, NP  ibuprofen (ADVIL,MOTRIN)  600 MG tablet Take 1 tablet (600 mg total) by mouth every 6 (six) hours. 04/01/18   Tamera Stands, DO  methocarbamol (ROBAXIN) 500 MG tablet Take 1 tablet (500 mg total) by mouth 2 (two) times daily. 05/28/19   Maxwell Caul, PA-C  olopatadine (PATANOL) 0.1 % ophthalmic solution Place 1 drop into both eyes 2 (two) times daily. 08/24/19   Tanda Rockers, PA-C  omeprazole (PRILOSEC) 20 MG capsule Take 20 mg by mouth daily.    [provider]  Prenatal Vit-Fe Fumarate-FA (PREPLUS) 27-1 MG TABS TAKE 1 TABLET BY MOUTH DAILY 09/12/18   Armando Reichert, CNM  propranolol (INDERAL) 20 MG tablet Take 1 tablet (20 mg total) by mouth 2 (two) times daily. Patient not taking: Reported on 08/12/2019 12/23/18   Lomax, Amy, NP  rizatriptan (MAXALT-MLT) 10 MG disintegrating tablet Take 1  tablet (10 mg total) by mouth as needed for migraine. May repeat in 2 hours if needed 08/12/19   Lomax, Amy, NP  sucralfate (CARAFATE) 1 g tablet Take 1 tablet (1 g total) by mouth 4 (four) times daily -  with meals and at bedtime for 10 days. 05/28/19 06/07/19  Maxwell Caul, PA-C  sucralfate (CARAFATE) 1 GM/10ML suspension Take 10 mLs (1 g total) by mouth 4 (four) times daily -  with meals and at bedtime. 05/28/19   Maxwell Caul, PA-C  topiramate (TOPAMAX) 25 MG tablet Take 1 tablet (25 mg total) by mouth 2 (two) times daily. 08/12/19   Lomax, Amy, NP    Allergies    Patient has no known allergies.  Review of Systems   Review of Systems  Constitutional: Negative for chills and fever.  Eyes: Positive for itching.  Gastrointestinal: Negative for nausea and vomiting.  Musculoskeletal: Positive for arthralgias.  Neurological: Positive for headaches. Negative for dizziness and light-headedness.  All other systems reviewed and are negative.   Physical Exam Updated Vital Signs BP 113/72 (BP Location: Right Arm)   Pulse 81   Temp 98.3 F (36.8 C) (Oral)   Resp 18   LMP 07/24/2019 (Approximate)   SpO2 99%   Breastfeeding Yes   Physical Exam Vitals and nursing note reviewed.  Constitutional:      Appearance: She is not ill-appearing or diaphoretic.  HENT:     Head: Normocephalic and atraumatic.  Eyes:     General:        Right eye: No discharge.        Left eye: No discharge.     Extraocular Movements: Extraocular movements intact.     Conjunctiva/sclera: Conjunctivae normal.     Pupils: Pupils are equal, round, and reactive to light.     Comments: Some watering to bilateral eyes. Pt sporadically with scratch her eyes due to itching  Cardiovascular:     Rate and Rhythm: Normal rate and regular rhythm.     Pulses: Normal pulses.  Pulmonary:     Effort: Pulmonary effort is normal.     Breath sounds: Normal breath sounds. No wheezing, rhonchi or rales.  Abdominal:      Palpations: Abdomen is soft.     Tenderness: There is no abdominal tenderness. There is no guarding or rebound.  Musculoskeletal:     Cervical back: Neck supple.  Skin:    General: Skin is warm and dry.  Neurological:     Mental Status: She is alert.     Comments: CN 3-12 grossly intact A&O x4 GCS 15 Sensation and strength intact  Gait nonataxic including with tandem walking Coordination with finger-to-nose WNL Neg romberg, neg pronator drift     ED Results / Procedures / Treatments   Labs (all labs ordered are listed, but only abnormal results are displayed) Labs Reviewed - No data to display  EKG None  Radiology No results found.  Procedures Procedures (including critical care time)  Medications Ordered in ED Medications - No data to display  ED Course  I have reviewed the triage vital signs and the nursing notes.  Pertinent labs & imaging results that were available during my care of the patient were reviewed by me and considered in my medical decision making (see chart for details).    MDM Rules/Calculators/A&P                      34 year old female who presents to the ED with multiple complaints. Currently complaining of bilateral eye itching since mowing the grass several days ago. No FB sensation despite triage report stating this. No pain or vision changes. Pt without any signs of injection to her eyes. She is sporadically scratching them due to itchiness. Has been taking Allegra and Benadryl without relief. Suspect allergic conjunctivitis at this time. Will prescribe olopatadine eye drops. Pt also reports intermitting leg pain bilaterally after she stands for prolonged periods of time. Feel this is a chronic issue and does not warrant further imaging or testing at this time. Pt advised to take Ibuprofen and Tylenol as needed. No signs of DVT today. Husband also requesting imaging of pt's headache due to frequent headaches. Pt has no focal neuro deficits on exam  today. She is being followed by neurology in the outpatient setting with amitryptiline for her headaches. They have discussed imaging with neurology and they recommended holding off at this point and to try the medication first. I have advised they mention this to neurology again. Pt is not complaining of a headache at this time. I do not feel she needs emergent imaging here. Pt stable for discharge home at this time with PCP follow up.   This note was prepared using Dragon voice recognition software and may include unintentional dictation errors due to the inherent limitations of voice recognition software.  Final Clinical Impression(s) / ED Diagnoses Final diagnoses:  Allergic conjunctivitis of both eyes  Other migraine without status migrainosus, not intractable    Rx / DC Orders ED Discharge Orders         Ordered    olopatadine (PATANOL) 0.1 % ophthalmic solution  2 times daily     08/24/19 2149           Discharge Instructions     Pick up eyedrops and use as prescribed. Continue taking allergy medication for your symptoms as well  Take Ibuprofen and Tylenol as needed for pain Follow up with Neurology for your headache       Eustaquio Maize, PA-C 08/24/19 2314    Truddie Hidden, MD 08/24/19 2317

## 2019-08-24 NOTE — Discharge Instructions (Addendum)
Pick up eyedrops and use as prescribed. Continue taking allergy medication for your symptoms as well  Take Ibuprofen and Tylenol as needed for pain Follow up with Neurology for your headache

## 2019-08-24 NOTE — ED Triage Notes (Addendum)
Pt's husband speaking for pt. States she was going to mow the grass 2 days ago and c/o getting something into her eyes. C/o itching. Also c/o bilateral leg pain "for a while" and headache for which she has referral to [facility] and takes medications

## 2019-11-10 ENCOUNTER — Other Ambulatory Visit: Payer: Self-pay | Admitting: *Deleted

## 2019-11-10 NOTE — Telephone Encounter (Signed)
Received a refill request for [place] . Patient has not been seen since delivery 2019. This medication not ordered by our provider. Denied refill request.  Vanya Carberry,RN

## 2020-02-12 ENCOUNTER — Ambulatory Visit: Payer: No Typology Code available for payment source | Admitting: Family Medicine

## 2020-05-11 ENCOUNTER — Ambulatory Visit: Payer: No Typology Code available for payment source | Admitting: Family Medicine

## 2020-05-11 ENCOUNTER — Encounter: Payer: Self-pay | Admitting: Family Medicine

## 2020-05-11 NOTE — Progress Notes (Deleted)
No chief complaint on file.    HISTORY OF PRESENT ILLNESS: Today 05/11/20  Samantha [name] is a 34 y.o. female here today for follow up for migraines. She was started on topiramate and rizatriptan at last visit in 07/2019.    HISTORY (copied from previous note)  Samantha [name] is a 34 y.o. female here today for follow up for migraines. We started propranolol for prevention at last visit as she was breastfeeding. She took this for about 1 month but states it was not effective. No adverse effect. Headaches continue. Migraines occur 3-4 times a week. She is no longer breastfeeding. Her husband reports they do not wish to have any more children. She is not currently on birth control but they use condoms with intercourse.     HISTORY: (copied from my note on 12/16/2018)  Samantha [name] a 34 y.o.femalehere today for follow up for migraines.She was last seen in 2018 when Dr Lucia Gaskins started her on Effexor for prevention. She states that she took one tablet and did not like the way it made her feel. A few months later she became pregnant and was told by her OB/GYN not to start medication. She is 8 months post partum and headaches have returned. She reports nearly daily headaches with 2-3 of those being migrainous each week. She does have throbbing pain with light/sound sensitivity and nausea. She is taking ibuprofen OTC on a daily basis. She has 4 children. She is breastfeeding. She is hesitant to take any medications that could effect her baby.  HISTORY: (copied fromDr Ahern'snote on 04/27/2017)  Samantha [name] a 34 y.o.femalehere as a referral from Dr. Maryfrances Bunnell migrianes. She is here with an interpreter.PMHx depression, migraine.She has been non cpm[liant with medication, she did not take her Topamax every day and stopped it on her own. She only took a few and it didn;t help so she stopped. Explained that medication takes 8-12 weeks, she can;t stop it. She takes Excedrin which  helps her headaches. Her Headaches are throbbing around the eyes, with light and sound sensitivity. She has had headaches for years. They have no changed in quality. She has migraines 4-5 days a month. She says she is headache free 25 days a month. She can take Excedrin as long as she takes it only 4-5 times a month. Discussed medication rebound, as long as she doesn't take it more than 10 days in a month. She has kids at home. She endorses depression, sadness, she is sad some most days and she does cry. No vision changes, no paresthesias or weakness, no changes in quality, severity has improved from July, no red flags. Headaches started as child.   Reviewed notes, labs and imaging from outside physicians, which showed:  CMP unremarkable, tsh nml  Reviewed referring physician notes. Patient was seen multiple times in July September and October for headaches. Patient describing unilateral headaches almost daily, in the setting of stress or sadness, been taking Motrin or Aleve, which has caused GI upset and she is been to the emergency room multiple times in the last year or 2 for abdominal pain. She also describes light sensitivity, sound sensitivity, nausea. She was given Fioricet which did not improve symptoms. Headaches are throbbing and painful. At initial appointment the headaches were daily, in September she described 9-10 times per month. Taking Excedrin. In October she described them as 4 times per month. She was prescribed Topamax and Imitrex. Lying down in a dark room helps. Headaches are frontal. No associated  visual disturbances, vomiting.    REVIEW OF SYSTEMS: Out of a complete 14 system review of symptoms, the patient complains only of the following symptoms, and all other reviewed systems are negative.   ALLERGIES: No Known Allergies   HOME MEDICATIONS: Outpatient Medications Prior to Visit  Medication Sig Dispense Refill  . amitriptyline (ELAVIL) 10 MG tablet Take  1 tablet (10 mg total) by mouth at bedtime. 30 tablet 11  . Elastic Bandages & Supports (COMFORT FIT MATERNITY SUPP LG) MISC 1 each by Does not apply route daily as needed. 1 each 0  . ibuprofen (ADVIL,MOTRIN) 600 MG tablet Take 1 tablet (600 mg total) by mouth every 6 (six) hours. 30 tablet 0  . methocarbamol (ROBAXIN) 500 MG tablet Take 1 tablet (500 mg total) by mouth 2 (two) times daily. 20 tablet 0  . olopatadine (PATANOL) 0.1 % ophthalmic solution Place 1 drop into both eyes 2 (two) times daily. 5 mL 12  . omeprazole (PRILOSEC) 20 MG capsule Take 20 mg by mouth daily.    . Prenatal Vit-Fe Fumarate-FA (PREPLUS) 27-1 MG TABS TAKE 1 TABLET BY MOUTH DAILY 30 tablet 11  . propranolol (INDERAL) 20 MG tablet Take 1 tablet (20 mg total) by mouth 2 (two) times daily. (Patient not taking: Reported on 08/12/2019) 60 tablet 5  . rizatriptan (MAXALT-MLT) 10 MG disintegrating tablet Take 1 tablet (10 mg total) by mouth as needed for migraine. May repeat in 2 hours if needed 9 tablet 11  . sucralfate (CARAFATE) 1 g tablet Take 1 tablet (1 g total) by mouth 4 (four) times daily -  with meals and at bedtime for 10 days. 40 tablet 0  . sucralfate (CARAFATE) 1 GM/10ML suspension Take 10 mLs (1 g total) by mouth 4 (four) times daily -  with meals and at bedtime. 420 mL 0  . topiramate (TOPAMAX) 25 MG tablet Take 1 tablet (25 mg total) by mouth 2 (two) times daily. 180 tablet 3   No facility-administered medications prior to visit.     PAST MEDICAL HISTORY: Past Medical History:  Diagnosis Date  . Migraine      PAST SURGICAL HISTORY: Past Surgical History:  Procedure Laterality Date  . CESAREAN SECTION       FAMILY HISTORY: Family History  Problem Relation Age of Onset  . Asthma Father   . Migraines Mother      SOCIAL HISTORY: Social History   Socioeconomic History  . Marital status: Married    Spouse name: Not on file  . Number of children: Not on file  . Years of education: Not on  file  . Highest education level: Not on file  Occupational History  . Not on file  Tobacco Use  . Smoking status: Never Smoker  . Smokeless tobacco: Never Used  Vaping Use  . Vaping Use: Never used  Substance and Sexual Activity  . Alcohol use: No  . Drug use: No  . Sexual activity: Yes    Birth control/protection: None  Other Topics Concern  . Not on file  Social History Narrative  . Not on file   Social Determinants of Health   Financial Resource Strain: Not on file  Food Insecurity: Not on file  Transportation Needs: Not on file  Physical Activity: Not on file  Stress: Not on file  Social Connections: Not on file  Intimate Partner Violence: Not on file      PHYSICAL EXAM  There were no vitals filed for this visit. There  is no height or weight on file to calculate BMI.   Generalized: Well developed, in no acute distress  Cardiology: normal rate and rhythm, no murmur auscultated  Respiratory: clear to auscultation bilaterally    Neurological examination  Mentation: Alert oriented to time, place, history taking. Follows all commands speech and language fluent Cranial nerve II-XII: Pupils were equal round reactive to light. Extraocular movements were full, visual field were full on confrontational test. Facial sensation and strength were normal. Uvula tongue midline. Head turning and shoulder shrug  were normal and symmetric. Motor: The motor testing reveals 5 over 5 strength of all 4 extremities. Good symmetric motor tone is noted throughout.  Sensory: Sensory testing is intact to soft touch on all 4 extremities. No evidence of extinction is noted.  Coordination: Cerebellar testing reveals good finger-nose-finger and heel-to-shin bilaterally.  Gait and station: Gait is normal. Tandem gait is normal. Romberg is negative. No drift is seen.  Reflexes: Deep tendon reflexes are symmetric and normal bilaterally.     DIAGNOSTIC DATA (LABS, IMAGING, TESTING) - I  reviewed patient records, labs, notes, testing and imaging myself where available.  Lab Results  Component Value Date   WBC 8.3 05/28/2019   HGB 13.5 05/28/2019   HCT 41.6 05/28/2019   MCV 90.0 05/28/2019   PLT 258 05/28/2019      Component Value Date/Time   NA 134 (L) 05/28/2019 1754   K 3.8 05/28/2019 1754   CL 105 05/28/2019 1754   CO2 24 05/28/2019 1754   GLUCOSE 136 (H) 05/28/2019 1754   GLUCOSE 78 09/03/2014 1434   BUN 20 05/28/2019 1754   CREATININE 0.70 05/28/2019 1754   CREATININE 0.70 11/30/2016 1555   CALCIUM 8.8 (L) 05/28/2019 1754   PROT 8.0 05/28/2019 1754   ALBUMIN 4.2 05/28/2019 1754   AST 26 05/28/2019 1754   ALT 30 05/28/2019 1754   ALKPHOS 64 05/28/2019 1754   BILITOT 0.5 05/28/2019 1754   GFRNONAA >60 05/28/2019 1754   GFRNONAA >89 11/30/2016 1555   GFRAA >60 05/28/2019 1754   GFRAA >89 11/30/2016 1555   No results found for: CHOL, HDL, LDLCALC, LDLDIRECT, TRIG, CHOLHDL Lab Results  Component Value Date   HGBA1C 5.1 01/03/2016   No results found for: [id number] Lab Results  Component Value Date   TSH 1.12 11/30/2016    No flowsheet data found.   ASSESSMENT AND PLAN  34 y.o. year old female  has a past medical history of Migraine. here with   No diagnosis found.    No orders of the defined types were placed in this encounter.     I spent 20 minutes of face-to-face and non-face-to-face time with patient.  This included previsit chart review, lab review, study review, order entry, electronic health record documentation, patient education.    Shawnie Dapper, MSN, FNP-C 05/11/2020, 7:16 AM  [facility] [address] [place], [place] [address] [phone]

## 2020-07-13 ENCOUNTER — Encounter: Payer: Self-pay | Admitting: Family Medicine

## 2020-07-13 ENCOUNTER — Ambulatory Visit: Payer: No Typology Code available for payment source | Admitting: Family Medicine

## 2020-07-13 NOTE — Progress Notes (Deleted)
PATIENT: Samantha [name] DOB: 1986-03-05  REASON FOR VISIT: follow up HISTORY FROM: patient  No chief complaint on file.    HISTORY OF PRESENT ILLNESS: 07/13/20 ALL:  She returns for migraine follow up. We started topiramate at last follow up. Her husband called the next day stating that she was fearful of side effects of topiramate. Amitriptyline was offered.    08/12/2019 ALL:  Samantha [name] is a 35 y.o. female here today for follow up for migraines. We started propranolol for prevention at last visit as she was breastfeeding. She took this for about 1 month but states it was not effective. No adverse effect. Headaches continue. Migraines occur 3-4 times a week. She is no longer breastfeeding. Her husband reports they do not wish to have any more children. She is not currently on birth control but they use condoms with intercourse.     HISTORY: (copied from my note on 12/16/2018)  Samantha [name] is a 35 y.o. female here today for follow up for migraines. She was last seen in 2018 when Dr Lucia Gaskins started her on Effexor for prevention. She states that she took one tablet and did not like the way it made her feel. A few months later she became pregnant and was told by her OB/GYN not to start medication. She is 8 months post partum and headaches have returned. She reports nearly daily headaches with 2-3 of those being migrainous each week. She does have throbbing pain with light/sound sensitivity and nausea. She is taking ibuprofen OTC on a daily basis. She has 4 children. She is breastfeeding. She is hesitant to take any medications that could effect her baby.   HISTORY: (copied from Dr Trevor Mace note on 04/27/2017)  Samantha [name] a 35 y.o.femalehere as a referral from Dr. Maryfrances Bunnell migrianes. She is here with an interpreter.PMHx depression, migraine.She has been non cpm[liant with medication, she did not take her Topamax every day and stopped it on her own. She only took a few and it  didn;t help so she stopped. Explained that medication takes 8-12 weeks, she can;t stop it. She takes Excedrin which helps her headaches. Her Headaches are throbbing around the eyes, with light and sound sensitivity. She has had headaches for years. They have no changed in quality. She has migraines 4-5 days a month. She says she is headache free 25 days a month. She can take Excedrin as long as she takes it only 4-5 times a month. Discussed medication rebound, as long as she doesn't take it more than 10 days in a month. She has kids at home. She endorses depression, sadness, she is sad some most days and she does cry. No vision changes, no paresthesias or weakness, no changes in quality, severity has improved from July, no red flags. Headaches started as child.   Reviewed notes, labs and imaging from outside physicians, which showed:  CMP unremarkable, tsh nml  Reviewed referring physician notes. Patient was seen multiple times in July September and October for headaches. Patient describing unilateral headaches almost daily, in the setting of stress or sadness, been taking Motrin or Aleve, which has caused GI upset and she is been to the emergency room multiple times in the last year or 2 for abdominal pain. She also describes light sensitivity, sound sensitivity, nausea. She was given Fioricet which did not improve symptoms. Headaches are throbbing and painful. At initial appointment the headaches were daily, in September she described 9-10 times per month. Taking Excedrin. In October she  described them as 4 times per month. She was prescribed Topamax and Imitrex. Lying down in a dark room helps. Headaches are frontal. No associated visual disturbances, vomiting.   REVIEW OF SYSTEMS: Out of a complete 14 system review of symptoms, the patient complains only of the following symptoms, headaches and all other reviewed systems are negative.   ALLERGIES: No Known Allergies  HOME  MEDICATIONS: Outpatient Medications Prior to Visit  Medication Sig Dispense Refill  . amitriptyline (ELAVIL) 10 MG tablet Take 1 tablet (10 mg total) by mouth at bedtime. 30 tablet 11  . Elastic Bandages & Supports (COMFORT FIT MATERNITY SUPP LG) MISC 1 each by Does not apply route daily as needed. 1 each 0  . ibuprofen (ADVIL,MOTRIN) 600 MG tablet Take 1 tablet (600 mg total) by mouth every 6 (six) hours. 30 tablet 0  . methocarbamol (ROBAXIN) 500 MG tablet Take 1 tablet (500 mg total) by mouth 2 (two) times daily. 20 tablet 0  . olopatadine (PATANOL) 0.1 % ophthalmic solution Place 1 drop into both eyes 2 (two) times daily. 5 mL 12  . omeprazole (PRILOSEC) 20 MG capsule Take 20 mg by mouth daily.    . Prenatal Vit-Fe Fumarate-FA (PREPLUS) 27-1 MG TABS TAKE 1 TABLET BY MOUTH DAILY 30 tablet 11  . propranolol (INDERAL) 20 MG tablet Take 1 tablet (20 mg total) by mouth 2 (two) times daily. (Patient not taking: Reported on 08/12/2019) 60 tablet 5  . rizatriptan (MAXALT-MLT) 10 MG disintegrating tablet Take 1 tablet (10 mg total) by mouth as needed for migraine. May repeat in 2 hours if needed 9 tablet 11  . sucralfate (CARAFATE) 1 g tablet Take 1 tablet (1 g total) by mouth 4 (four) times daily -  with meals and at bedtime for 10 days. 40 tablet 0  . sucralfate (CARAFATE) 1 GM/10ML suspension Take 10 mLs (1 g total) by mouth 4 (four) times daily -  with meals and at bedtime. 420 mL 0  . topiramate (TOPAMAX) 25 MG tablet Take 1 tablet (25 mg total) by mouth 2 (two) times daily. 180 tablet 3   No facility-administered medications prior to visit.    PAST MEDICAL HISTORY: Past Medical History:  Diagnosis Date  . Migraine     PAST SURGICAL HISTORY: Past Surgical History:  Procedure Laterality Date  . CESAREAN SECTION      FAMILY HISTORY: Family History  Problem Relation Age of Onset  . Asthma Father   . Migraines Mother     SOCIAL HISTORY: Social History   Socioeconomic History  .  Marital status: Married    Spouse name: Not on file  . Number of children: Not on file  . Years of education: Not on file  . Highest education level: Not on file  Occupational History  . Not on file  Tobacco Use  . Smoking status: Never Smoker  . Smokeless tobacco: Never Used  Vaping Use  . Vaping Use: Never used  Substance and Sexual Activity  . Alcohol use: No  . Drug use: No  . Sexual activity: Yes    Birth control/protection: None  Other Topics Concern  . Not on file  Social History Narrative  . Not on file   Social Determinants of Health   Financial Resource Strain: Not on file  Food Insecurity: Not on file  Transportation Needs: Not on file  Physical Activity: Not on file  Stress: Not on file  Social Connections: Not on file  Intimate Partner Violence:  Not on file      PHYSICAL EXAM  There were no vitals filed for this visit. There is no height or weight on file to calculate BMI.  Generalized: Well developed, in no acute distress  Neurological examination  Mentation: Alert oriented to time, place, history taking. Follows all commands speech and language fluent Cranial nerve II-XII: Pupils were equal round reactive to light. Extraocular movements were full, visual field were full  Motor: The motor testing reveals 5 over 5 strength of all 4 extremities. Good symmetric motor tone is noted throughout.  Gait and station: Gait is normal.   DIAGNOSTIC DATA (LABS, IMAGING, TESTING) - I reviewed patient records, labs, notes, testing and imaging myself where available.  No flowsheet data found.   Lab Results  Component Value Date   WBC 8.3 05/28/2019   HGB 13.5 05/28/2019   HCT 41.6 05/28/2019   MCV 90.0 05/28/2019   PLT 258 05/28/2019      Component Value Date/Time   NA 134 (L) 05/28/2019 1754   K 3.8 05/28/2019 1754   CL 105 05/28/2019 1754   CO2 24 05/28/2019 1754   GLUCOSE 136 (H) 05/28/2019 1754   GLUCOSE 78 09/03/2014 1434   BUN 20 05/28/2019 1754    CREATININE 0.70 05/28/2019 1754   CREATININE 0.70 11/30/2016 1555   CALCIUM 8.8 (L) 05/28/2019 1754   PROT 8.0 05/28/2019 1754   ALBUMIN 4.2 05/28/2019 1754   AST 26 05/28/2019 1754   ALT 30 05/28/2019 1754   ALKPHOS 64 05/28/2019 1754   BILITOT 0.5 05/28/2019 1754   GFRNONAA >60 05/28/2019 1754   GFRNONAA >89 11/30/2016 1555   GFRAA >60 05/28/2019 1754   GFRAA >89 11/30/2016 1555   No results found for: CHOL, HDL, LDLCALC, LDLDIRECT, TRIG, CHOLHDL Lab Results  Component Value Date   HGBA1C 5.1 01/03/2016   No results found for: [id number] Lab Results  Component Value Date   TSH 1.12 11/30/2016       ASSESSMENT AND PLAN 35 y.o. year old female  has a past medical history of Migraine. here with   No diagnosis found.   Kaylanni continues to have near daily headaches with 12-16 migrainous headaches each month. Propranolol was ineffective. We will start topiramate 25mg  twice daily. She was advised of potential side effects of this medication. She was also advised not to become pregnant while taking this medication as there are increased risks of birth defects. She will continue to use condoms with each sexual encounter and notify me immediately for any concerns of pregnancy. We will start rizatriptan as needed for abortive therapy. She was educated on appropriate administration of this medication. She will avoid taking OTC analgesics on regular basis. Adequate hydration and healthy lifestyle habits advised. She will follow up in 6 months, sooner if needed. She and her husband verbalize understanding and agreement with this plan.    No orders of the defined types were placed in this encounter.    No orders of the defined types were placed in this encounter.     I spent 35 minutes with the patient. 50% of this time was spent counseling and educating patient on plan of care and medications.    , FNP-C 07/13/2020, 8:25 AM [facility] [address]  [address] [place], [place] [phone]

## 2020-07-13 NOTE — Patient Instructions (Incomplete)
Below is our plan:  We will ***  Please make sure you are staying well hydrated. I recommend 50-60 ounces daily. Well balanced diet and regular exercise encouraged. Consistent sleep schedule with 6-8 hours recommended.   Please continue follow up with care team as directed.   Follow up with *** in ***  You may receive a survey regarding today's visit. I encourage you to leave honest feed back as I do use this information to improve patient care. Thank you for seeing me today!      Migraine Headache A migraine headache is a very strong throbbing pain on one side or both sides of your head. This type of headache can also cause other symptoms. It can last from 4 hours to 3 days. Talk with your doctor about what things may bring on (trigger) this condition. What are the causes? The exact cause of this condition is not known. This condition may be triggered or caused by:  Drinking alcohol.  Smoking.  Taking medicines, such as: ? Medicine used to treat chest pain (nitroglycerin). ? Birth control pills. ? Estrogen. ? Some blood pressure medicines.  Eating or drinking certain products.  Doing physical activity. Other things that may trigger a migraine headache include:  Having a menstrual period.  Pregnancy.  Hunger.  Stress.  Not getting enough sleep or getting too much sleep.  Weather changes.  Tiredness (fatigue). What increases the risk?  Being 25-55 years old.  Being female.  Having a family history of migraine headaches.  Being Caucasian.  Having depression or anxiety.  Being very overweight. What are the signs or symptoms?  A throbbing pain. This pain may: ? Happen in any area of the head, such as on one side or both sides. ? Make it hard to do daily activities. ? Get worse with physical activity. ? Get worse around bright lights or loud noises.  Other symptoms may include: ? Feeling sick to your stomach  (nauseous). ? Vomiting. ? Dizziness. ? Being sensitive to bright lights, loud noises, or smells.  Before you get a migraine headache, you may get warning signs (an aura). An aura may include: ? Seeing flashing lights or having blind spots. ? Seeing bright spots, halos, or zigzag lines. ? Having tunnel vision or blurred vision. ? Having numbness or a tingling feeling. ? Having trouble talking. ? Having weak muscles.  Some people have symptoms after a migraine headache (postdromal phase), such as: ? Tiredness. ? Trouble thinking (concentrating). How is this treated?  Taking medicines that: ? Relieve pain. ? Relieve the feeling of being sick to your stomach. ? Prevent migraine headaches.  Treatment may also include: ? Having acupuncture. ? Avoiding foods that bring on migraine headaches. ? Learning ways to control your body functions (biofeedback). ? Therapy to help you know and deal with negative thoughts (cognitive behavioral therapy). Follow these instructions at home: Medicines  Take over-the-counter and prescription medicines only as told by your doctor.  Ask your doctor if the medicine prescribed to you: ? Requires you to avoid driving or using heavy machinery. ? Can cause trouble pooping (constipation). You may need to take these steps to prevent or treat trouble pooping:  Drink enough fluid to keep your pee (urine) pale yellow.  Take over-the-counter or prescription medicines.  Eat foods that are high in fiber. These include beans, whole grains, and fresh fruits and vegetables.  Limit foods that are high in fat and sugar. These include fried or sweet foods. Lifestyle    Do not drink alcohol.  Do not use any products that contain nicotine or tobacco, such as cigarettes, e-cigarettes, and chewing tobacco. If you need help quitting, ask your doctor.  Get at least 8 hours of sleep every night.  Limit and deal with stress. General instructions  Keep a journal to  find out what may bring on your migraine headaches. For example, write down: ? What you eat and drink. ? How much sleep you get. ? Any change in what you eat or drink. ? Any change in your medicines.  If you have a migraine headache: ? Avoid things that make your symptoms worse, such as bright lights. ? It may help to lie down in a dark, quiet room. ? Do not drive or use heavy machinery. ? Ask your doctor what activities are safe for you.  Keep all follow-up visits as told by your doctor. This is important.      Contact a doctor if:  You get a migraine headache that is different or worse than others you have had.  You have more than 15 headache days in one month. Get help right away if:  Your migraine headache gets very bad.  Your migraine headache lasts longer than 72 hours.  You have a fever.  You have a stiff neck.  You have trouble seeing.  Your muscles feel weak or like you cannot control them.  You start to lose your balance a lot.  You start to have trouble walking.  You pass out (faint).  You have a seizure. Summary  A migraine headache is a very strong throbbing pain on one side or both sides of your head. These headaches can also cause other symptoms.  This condition may be treated with medicines and changes to your lifestyle.  Keep a journal to find out what may bring on your migraine headaches.  Contact a doctor if you get a migraine headache that is different or worse than others you have had.  Contact your doctor if you have more than 15 headache days in a month. This information is not intended to replace advice given to you by your health care provider. Make sure you discuss any questions you have with your health care provider. Document Revised: 08/23/2018 Document Reviewed: 06/13/2018 Elsevier Patient Education  2021 Elsevier Inc.  

## 2020-09-07 ENCOUNTER — Ambulatory Visit: Payer: PRIVATE HEALTH INSURANCE | Admitting: Allergy and Immunology

## 2020-09-07 ENCOUNTER — Other Ambulatory Visit: Payer: Self-pay

## 2020-09-07 VITALS — BP 110/80 | HR 74 | Temp 98.3°F | Resp 18 | Ht 61.25 in | Wt 167.4 lb

## 2020-09-07 DIAGNOSIS — J309 Allergic rhinitis, unspecified: Secondary | ICD-10-CM

## 2020-09-07 DIAGNOSIS — H1010 Acute atopic conjunctivitis, unspecified eye: Secondary | ICD-10-CM

## 2020-09-07 DIAGNOSIS — H1044 Vernal conjunctivitis: Secondary | ICD-10-CM

## 2020-09-07 DIAGNOSIS — J3089 Other allergic rhinitis: Secondary | ICD-10-CM

## 2020-09-07 MED ORDER — MONTELUKAST SODIUM 10 MG PO TABS: 10.0000 mg | ORAL_TABLET | Freq: Every day | ORAL | 5 refills | Status: AC

## 2020-09-07 NOTE — Patient Instructions (Addendum)
  1.  Allergen avoidance measures. Sports glasses while outside during spring. Never, ever rub eyes.  2.  Treat and prevent inflammation:   A. Montelukast 10 mg - 1 tablet 1 time per day  B. OTC Nasacort - 1-2 sprays each nostril 1 time per day  3. If needed:   A. OTC Cetirizine 10 mg - 1-2 tablets 1-2 times per day (MAX=40mg /day)  B. OTC Pataday - 1 drop each eye 1 time per day  C. OTC Systane eye drops - multiple times per day  4. Can continue loteprednol 0.5% - 1 drop each eye 1 time per day during flare up.  5. Immunotherapy???  6. Return to clinic in early March 2023 or earlier if problem

## 2020-09-07 NOTE — Progress Notes (Signed)
[place] [facility] [place] [facility] [place]   Dear Delfino Lovett,  Thank you for referring Samantha [name] to the [facility] [place] on 09/07/2020.   Below is a summation of this patient's evaluation and recommendations.  Thank you for your referral. I will keep you informed about this patient's response to treatment.   If you have any questions please do not hesitate to contact me.   Sincerely,  Jessica Priest, MD Allergy / Immunology [facility]   ______________________________________________________________________    NEW PATIENT NOTE  Referring Provider: Delfino Lovett, FNP Primary Provider: Patient, No Pcp Per (Inactive) Date of office visit: 09/07/2020    Subjective:   Chief Complaint:  Samantha [name] (DOB: 11-19-1985) is a 35 y.o. female who presents to the clinic on 09/07/2020 with a chief complaint of Allergic Rhinitis  (Bad eye allergies. Pollen is high she begins to itch and had to go to urgent care and started antibiotics and resulted in no infection) .     HPI: Samantha [name] presents to this clinic in evaluation of eye and nose problems that occur over the course of the past 2 springs.  Sometime in March through April she has had problems with very severe eye itching and swelling and redness and tearing along with nasal congestion and sneezing and has been treated with a multitude of various agents including what sounds like steroid injections and topical eye steroids and various antihistamines without a good effect.  Most recently it sounds as though she was treated with 2 "injections" and loteprednol 0.5% with some improvement regarding her eyes at about 30% and significant improvement regarding her upper airway issue.  She does not have any associated anosmia or headaches or ugly nasal discharge although it does sound as though she has been treated with antibiotics on several  occasions.  Past Medical History:  Diagnosis Date  . Migraine     Past Surgical History:  Procedure Laterality Date  . CESAREAN SECTION      Allergies as of 09/07/2020   No Known Allergies     Medication List      amitriptyline 10 MG tablet Commonly known as: ELAVIL Take 1 tablet (10 mg total) by mouth at bedtime.   Comfort Fit Maternity Supp Lg Misc 1 each by Does not apply route daily as needed.   cromolyn 4 % ophthalmic solution Commonly known as: OPTICROM SMARTSIG:In Eye(s)   erythromycin ophthalmic ointment SMARTSIG:Sparingly In Eye(s) Every 6 Hours   ibuprofen 600 MG tablet Commonly known as: ADVIL Take 1 tablet (600 mg total) by mouth every 6 (six) hours.   loteprednol 0.5 % ophthalmic suspension Commonly known as: LOTEMAX SMARTSIG:In Eye(s)   methocarbamol 500 MG tablet Commonly known as: ROBAXIN Take 1 tablet (500 mg total) by mouth 2 (two) times daily.   omeprazole 20 MG capsule Commonly known as: PRILOSEC Take 20 mg by mouth daily.   PrePLUS 27-1 MG Tabs TAKE 1 TABLET BY MOUTH DAILY   propranolol 20 MG tablet Commonly known as: INDERAL Take 1 tablet (20 mg total) by mouth 2 (two) times daily.   rizatriptan 10 MG disintegrating tablet Commonly known as: MAXALT-MLT Take 1 tablet (10 mg total) by mouth as needed for migraine. May repeat in 2 hours if needed   sucralfate 1 GM/10ML suspension Commonly known as: Carafate Take 10 mLs (1 g total) by mouth 4 (four) times daily -  with  meals and at bedtime.   sucralfate 1 g tablet Commonly known as: Carafate Take 1 tablet (1 g total) by mouth 4 (four) times daily -  with meals and at bedtime for 10 days.   topiramate 25 MG tablet Commonly known as: TOPAMAX Take 1 tablet (25 mg total) by mouth 2 (two) times daily.       Review of systems negative except as noted in HPI / PMHx or noted below:  Review of Systems  Constitutional: Negative.   HENT: Negative.   Eyes: Negative.   Respiratory:  Negative.   Cardiovascular: Negative.   Gastrointestinal: Negative.   Genitourinary: Negative.   Musculoskeletal: Negative.   Skin: Negative.   Neurological: Negative.   Endo/Heme/Allergies: Negative.   Psychiatric/Behavioral: Negative.     Family History  Problem Relation Age of Onset  . Asthma Father   . Migraines Mother     Social History   Socioeconomic History  . Marital status: Married    Spouse name: Not on file  . Number of children: Not on file  . Years of education: Not on file  . Highest education level: Not on file  Occupational History  . Not on file  Tobacco Use  . Smoking status: Never Smoker  . Smokeless tobacco: Never Used  Vaping Use  . Vaping Use: Never used  Substance and Sexual Activity  . Alcohol use: No  . Drug use: No  . Sexual activity: Yes    Birth control/protection: None  Other Topics Concern  . Not on file  Social History Narrative  . Not on file   Environmental and Social history  Lives in a house with a dry environment, no animals located inside the household, no carpet in the bedroom, plastic on the bed, plastic on the pillow, no smoking ongoing with inside the household.  Objective:   Vitals:   09/07/20 1016  BP: 110/80  Pulse: 74  Resp: 18  Temp: 98.3 F (36.8 C)  SpO2: 99%   Height: 5' 1.25" (155.6 cm) Weight: 167 lb 6.4 oz (75.9 kg)  Physical Exam Constitutional:      Appearance: She is not diaphoretic.  HENT:     Head: Normocephalic.     Right Ear: Tympanic membrane, ear canal and external ear normal.     Left Ear: Tympanic membrane, ear canal and external ear normal.     Nose: Nose normal. No mucosal edema or rhinorrhea.     Mouth/Throat:     Pharynx: Uvula midline. No oropharyngeal exudate.  Eyes:     Conjunctiva/sclera: Conjunctivae normal.  Neck:     Thyroid: No thyromegaly.     Trachea: Trachea normal. No tracheal tenderness or tracheal deviation.  Cardiovascular:     Rate and Rhythm: Normal rate and  regular rhythm.     Heart sounds: Normal heart sounds, S1 normal and S2 normal. No murmur heard.   Pulmonary:     Effort: No respiratory distress.     Breath sounds: Normal breath sounds. No stridor. No wheezing or rales.  Lymphadenopathy:     Head:     Right side of head: No tonsillar adenopathy.     Left side of head: No tonsillar adenopathy.     Cervical: No cervical adenopathy.  Skin:    Findings: No erythema or rash.     Nails: There is no clubbing.  Neurological:     Mental Status: She is alert.     Diagnostics: Allergy skin tests were performed.  She did not demonstrate any hypersensitivity against a screening panel of aeroallergens or foods.  Assessment and Plan:    1. Seasonal allergic rhinitis due to other allergic trigger   2. Seasonal allergic conjunctivitis   3. Vernal conjunctivitis of both eyes     1.  Allergen avoidance measures. Sports glasses while outside during spring. Never, ever rub eyes.  2.  Treat and prevent inflammation:   A. Montelukast 10 mg - 1 tablet 1 time per day  B. OTC Nasacort - 1-2 sprays each nostril 1 time per day  3. If needed:   A. OTC Cetirizine 10 mg - 1-2 tablets 1-2 times per day (MAX=40mg /day)  B. OTC Pataday - 1 drop each eye 1 time per day  C. OTC Systane eye drops - multiple times per day  4. Can continue loteprednol 0.5% - 1 drop each eye 1 time per day during flare up.  5. Immunotherapy???  6. Return to clinic in early March 2023 or earlier if problem  Samantha [name] most likely has a component of vernal conjunctivitis affecting her eyes and she may have some localized allergy on her conjunctiva as well.  We will treat her with a combination of anti-inflammatory agents as noted above and we will see what happens as she moves forward with this plan.  She is not a candidate for immunotherapy as she did not demonstrate any hypersensitivity to aeroallergens on today skin testing.  She can continue the topical steroid prescribed  by her primary care doctor.  I suspect within a few more weeks her season will be over and then she will do well and we will need to see how she does next spring on the preventative plan of using a nasal steroid on a consistent basis and a leukotrienes modifier and a consistent basis.  Jessica Priest, MD Allergy / Immunology [facility] [place]

## 2020-09-08 ENCOUNTER — Encounter: Payer: Self-pay | Admitting: Allergy and Immunology

## 2021-03-10 ENCOUNTER — Emergency Department (HOSPITAL_BASED_OUTPATIENT_CLINIC_OR_DEPARTMENT_OTHER): Payer: PRIVATE HEALTH INSURANCE

## 2021-03-10 ENCOUNTER — Emergency Department (HOSPITAL_BASED_OUTPATIENT_CLINIC_OR_DEPARTMENT_OTHER)
Admission: EM | Admit: 2021-03-10 | Discharge: 2021-03-10 | Disposition: A | Discharge status: Home / Self Care | Payer: PRIVATE HEALTH INSURANCE | Attending: Student | Admitting: Student

## 2021-03-10 ENCOUNTER — Encounter (HOSPITAL_BASED_OUTPATIENT_CLINIC_OR_DEPARTMENT_OTHER): Payer: Self-pay | Admitting: Emergency Medicine

## 2021-03-10 ENCOUNTER — Other Ambulatory Visit: Payer: Self-pay

## 2021-03-10 DIAGNOSIS — R059 Cough, unspecified: Secondary | ICD-10-CM | POA: Diagnosis present

## 2021-03-10 DIAGNOSIS — U071 COVID-19: Secondary | ICD-10-CM | POA: Diagnosis not present

## 2021-03-10 LAB — RESP PANEL BY RT-PCR (FLU A&B, COVID) ARPGX2
Influenza A by PCR: NEGATIVE
Influenza B by PCR: NEGATIVE
SARS Coronavirus 2 by RT PCR: POSITIVE — AB

## 2021-03-10 NOTE — ED Notes (Signed)
D/c paperwork reviewed with pt and spouse. All questions answered prior to d/c. Pt ambulatory to ED exit on RA, NAD.

## 2021-03-10 NOTE — ED Provider Notes (Signed)
[facility] EMERGENCY DEPARTMENT Provider Note   CSN: [id number] Arrival date & time: 03/10/21  7829     History Chief Complaint  Patient presents with   Generalized Body Aches   Sore Throat    Samantha [name] is a 35 y.o. female.  35 y.o female with no PMH presents to the ED with a chief complaint of [place] symptoms.  Patient endorses body aches, sore throat, nonproductive cough for the past 3 weeks.  According to husband patient has been sick for over 3 weeks.  She has been taking over-the-counter medication including tea, Tylenol, to help with symptoms without much improvement.  He does report her husband tested positive for flu approximately 3 weeks ago.  No exacerbated factors.  Denies any chest pain, shortness of breath, prior history of asthma. NO prior hx of blood clots.   The history is provided by the patient and the spouse.  Sore Throat Pertinent negatives include no chest pain and no shortness of breath.      Past Medical History:  Diagnosis Date   Migraine     Patient Active Problem List   Diagnosis Date Noted   Ureteral stone 04/01/2018   Indication for care in labor or delivery 03/29/2018   Back pain affecting pregnancy in third trimester 02/19/2018   Language barrier 01/21/2018   History of cesarean section 10/15/2017   Supervision of other normal pregnancy, antepartum 09/10/2017   Migraine without aura and without status migrainosus, not intractable 04/30/2017   History of VBAC 07/30/2014    Past Surgical History:  Procedure Laterality Date   CESAREAN SECTION       OB History     Gravida  3   Para  3   Term  3   Preterm      AB      Living  3      SAB      IAB      Ectopic      Multiple  0   Live Births  3           Family History  Problem Relation Age of Onset   Asthma Father    Migraines Mother     Social History   Tobacco Use   Smoking status: Never   Smokeless tobacco: Never  Vaping Use   Vaping  Use: Never used  Substance Use Topics   Alcohol use: No   Drug use: No    Home Medications Prior to Admission medications   Medication Sig Start Date End Date Taking? Authorizing Provider  amitriptyline (ELAVIL) 10 MG tablet Take 1 tablet (10 mg total) by mouth at bedtime. Patient not taking: No sig reported 08/19/19   Lomax, Amy, NP  cromolyn (OPTICROM) 4 % ophthalmic solution SMARTSIG:In Eye(s) 08/19/20   [provider]  Elastic Bandages & Supports (COMFORT FIT MATERNITY SUPP LG) MISC 1 each by Does not apply route daily as needed. 02/19/18   Rasch, Victorino Dike I, NP  erythromycin ophthalmic ointment SMARTSIG:Sparingly In Eye(s) Every 6 Hours 08/24/20   [provider]  fluticasone (FLONASE) 50 MCG/ACT nasal spray SHAKE LIQUID AND USE 2 SPRAYS IN EACH NOSTRIL TWICE DAILY 08/19/20   [provider]  ibuprofen (ADVIL,MOTRIN) 600 MG tablet Take 1 tablet (600 mg total) by mouth every 6 (six) hours. 04/01/18   Tamera Stands, DO  levocetirizine (XYZAL) 5 MG tablet SMARTSIG:1 Tablet(s) By Mouth Every Evening 08/19/20   [provider]  loteprednol (LOTEMAX) 0.5 %  ophthalmic suspension SMARTSIG:In Eye(s) 09/03/20   [provider]  methocarbamol (ROBAXIN) 500 MG tablet Take 1 tablet (500 mg total) by mouth 2 (two) times daily. Patient not taking: No sig reported 05/28/19   Graciella Freer A, PA-C  montelukast (SINGULAIR) 10 MG tablet Take 1 tablet (10 mg total) by mouth at bedtime. 09/07/20   Kozlow, Alvira Philips, MD  olopatadine (PATANOL) 0.1 % ophthalmic solution Place 1 drop into both eyes 2 (two) times daily. 08/24/19   Venter, Margaux, PA-C  Olopatadine HCl 0.2 % SOLN Place into both eyes 2 times daily.    [provider]  omeprazole (PRILOSEC) 20 MG capsule Take 20 mg by mouth daily. Patient not taking: No sig reported    [provider]  predniSONE (DELTASONE) 5 MG tablet Take 5 mg by mouth daily. 08/26/20   [provider]  Prenatal  Vit-Fe Fumarate-FA (PREPLUS) 27-1 MG TABS TAKE 1 TABLET BY MOUTH DAILY 09/12/18   Armando Reichert, CNM  propranolol (INDERAL) 20 MG tablet Take 1 tablet (20 mg total) by mouth 2 (two) times daily. Patient not taking: No sig reported 12/23/18   Lomax, Amy, NP  rizatriptan (MAXALT-MLT) 10 MG disintegrating tablet Take 1 tablet (10 mg total) by mouth as needed for migraine. May repeat in 2 hours if needed Patient not taking: No sig reported 08/12/19   Lomax, Amy, NP  sucralfate (CARAFATE) 1 g tablet Take 1 tablet (1 g total) by mouth 4 (four) times daily -  with meals and at bedtime for 10 days. 05/28/19 06/07/19  Maxwell Caul, PA-C  sucralfate (CARAFATE) 1 GM/10ML suspension Take 10 mLs (1 g total) by mouth 4 (four) times daily -  with meals and at bedtime. 05/28/19   Maxwell Caul, PA-C  topiramate (TOPAMAX) 25 MG tablet Take 1 tablet (25 mg total) by mouth 2 (two) times daily. Patient not taking: No sig reported 08/12/19   Shawnie Dapper, NP    Allergies    Patient has no known allergies.  Review of Systems   Review of Systems  Constitutional:  Negative for chills and fever.  HENT:  Positive for sore throat.   Respiratory:  Positive for cough. Negative for shortness of breath.   Cardiovascular:  Negative for chest pain.  Musculoskeletal:  Positive for myalgias.   Physical Exam Updated Vital Signs BP 132/89 (BP Location: Left Arm)   Pulse 98   Temp 98.5 F (36.9 C) (Oral)   Ht 5\' 3"  (1.6 m)   LMP 02/23/2021 (Exact Date)   SpO2 100%   BMI 29.65 kg/m   Physical Exam Vitals and nursing note reviewed.  Constitutional:      Appearance: She is well-developed. She is not ill-appearing.  HENT:     Head: Normocephalic and atraumatic.     Mouth/Throat:     Mouth: Mucous membranes are moist.     Tonsils: No tonsillar exudate or tonsillar abscesses. 1+ on the right. 1+ on the left.  Cardiovascular:     Rate and Rhythm: Normal rate.  Pulmonary:     Effort: Pulmonary effort is normal.      Comments: No wheezing, rhonchi, rales. Abdominal:     Palpations: Abdomen is soft.  Skin:    General: Skin is warm and dry.  Neurological:     Mental Status: She is alert and oriented to person, place, and time.    ED Results / Procedures / Treatments   Labs (all labs ordered are listed, but only  abnormal results are displayed) Labs Reviewed  RESP PANEL BY RT-PCR (FLU A&B, COVID) ARPGX2 - Abnormal; Notable for the following components:      Result Value   SARS Coronavirus 2 by RT PCR POSITIVE (*)    All other components within normal limits    EKG None  Radiology DG Chest 2 View  Result Date: 03/10/2021 CLINICAL DATA:  COVID for 3 weeks, generalized body aches, sore throat EXAM: CHEST - 2 VIEW COMPARISON:  None. FINDINGS: The cardiomediastinal silhouette is normal. There is no focal consolidation or pulmonary edema. There is no pleural effusion or pneumothorax. There is no acute osseous abnormality. IMPRESSION: No radiographic evidence of acute cardiopulmonary process. Electronically Signed   By: Lesia Hausen M.D.   On: 03/10/2021 11:51    Procedures Procedures   Medications Ordered in ED Medications - No data to display  ED Course  I have reviewed the triage vital signs and the nursing notes.  Pertinent labs & imaging results that were available during my care of the patient were reviewed by me and considered in my medical decision making (see chart for details).  Clinical Course as of 03/10/21 1200  Thu Mar 10, 2021  1114 SARS Coronavirus 2 by RT PCR(!): POSITIVE [JS]    Clinical Course User Index [JS] Claude Manges, PA-C   MDM Rules/Calculators/A&P    Patient with 3 weeks of URI symptoms, does report her husband was sick with the flu, she has had some nasal congestion, body aches, sore throat.  Has taken over-the-counter medication without much improvement in symptoms.  She does not have any chest pain, no shortness of breath, currently on no OCPs, no prior  history of cancer.  [place] primarily she is overall well-appearing, vitals are within normal limits, she is afebrile.  Lungs are clear to auscultation, however patient has been sick for the past 3 weeks, we did discuss chest x-ray to rule out any pneumonia.  Chest x-ray on today's visit is negative for any acute findings.  No tachycardia, no hypoxia.  Discussed return precautions with them along with over-the-counter treatment.  They are agreeable with plan and management, return precaution discussed.   Portions of this note were generated with Scientist, clinical (histocompatibility and immunogenetics). Dictation errors may occur despite best attempts at proofreading.  Final Clinical Impression(s) / ED Diagnoses Final diagnoses:  COVID-19 virus infection    Rx / DC Orders ED Discharge Orders     None        Claude Manges, PA-C 03/10/21 1200    Kommor, Wyn Forster, MD 03/10/21 1627

## 2021-03-10 NOTE — ED Triage Notes (Signed)
Pt POV c/o cold sx: genrealized body aches, sore throat starting 3 days ago, non productive cough.  Denies fever. Husband reports being sick approximately 3 weeks, tested + for flu

## 2021-03-10 NOTE — Discharge Instructions (Addendum)
You tested positive today for COVID-19.  You will need to quarantine at home for the next 5 days, along with wear a mask for the following 5 days.  If you experience any chest pain, shortness of breath you will need to return to the emergency department.
